# Patient Record
Sex: Male | Born: 1980 | Race: White | Hispanic: No | Marital: Single | Smoking: Former smoker
Health system: Southern US, Community
[De-identification: ages and names within clinical notes are randomized; demographics above are authoritative.]

## PROBLEM LIST (undated history)

## (undated) DIAGNOSIS — F329 Major depressive disorder, single episode, unspecified: Secondary | ICD-10-CM

## (undated) DIAGNOSIS — I10 Essential (primary) hypertension: Secondary | ICD-10-CM

## (undated) DIAGNOSIS — K219 Gastro-esophageal reflux disease without esophagitis: Secondary | ICD-10-CM

## (undated) DIAGNOSIS — N200 Calculus of kidney: Secondary | ICD-10-CM

## (undated) DIAGNOSIS — F32A Depression, unspecified: Secondary | ICD-10-CM

## (undated) DIAGNOSIS — F419 Anxiety disorder, unspecified: Secondary | ICD-10-CM

## (undated) HISTORY — DX: Major depressive disorder, single episode, unspecified: F32.9

## (undated) HISTORY — DX: Depression, unspecified: F32.A

## (undated) HISTORY — DX: Anxiety disorder, unspecified: F41.9

## (undated) HISTORY — DX: Essential (primary) hypertension: I10

## (undated) HISTORY — PX: LITHOTRIPSY: SUR834

## (undated) HISTORY — PX: INNER EAR SURGERY: SHX679

---

## 2014-03-02 ENCOUNTER — Encounter: Payer: Self-pay | Admitting: Family Medicine

## 2014-03-02 DIAGNOSIS — R109 Unspecified abdominal pain: Secondary | ICD-10-CM | POA: Insufficient documentation

## 2014-03-02 DIAGNOSIS — H7310 Chronic myringitis, unspecified ear: Secondary | ICD-10-CM

## 2014-03-02 DIAGNOSIS — H7010 Chronic mastoiditis, unspecified ear: Secondary | ICD-10-CM | POA: Insufficient documentation

## 2014-03-02 DIAGNOSIS — M5416 Radiculopathy, lumbar region: Secondary | ICD-10-CM | POA: Insufficient documentation

## 2014-03-02 DIAGNOSIS — H9190 Unspecified hearing loss, unspecified ear: Secondary | ICD-10-CM | POA: Insufficient documentation

## 2014-03-03 ENCOUNTER — Ambulatory Visit (INDEPENDENT_AMBULATORY_CARE_PROVIDER_SITE_OTHER): Payer: BC Managed Care – PPO | Admitting: Family Medicine

## 2014-03-03 ENCOUNTER — Encounter: Payer: Self-pay | Admitting: Family Medicine

## 2014-03-03 VITALS — BP 135/88 | HR 82 | Ht 69.0 in | Wt 167.0 lb

## 2014-03-03 DIAGNOSIS — F411 Generalized anxiety disorder: Secondary | ICD-10-CM

## 2014-03-03 DIAGNOSIS — R109 Unspecified abdominal pain: Secondary | ICD-10-CM

## 2014-03-03 DIAGNOSIS — H7102 Cholesteatoma of attic, left ear: Secondary | ICD-10-CM

## 2014-03-03 DIAGNOSIS — H7100 Cholesteatoma of attic, unspecified ear: Secondary | ICD-10-CM

## 2014-03-03 DIAGNOSIS — E785 Hyperlipidemia, unspecified: Secondary | ICD-10-CM

## 2014-03-03 MED ORDER — ALPRAZOLAM 0.25 MG PO TABS: 0.2500 mg | ORAL_TABLET | Freq: Every day | ORAL | Status: DC | PRN

## 2014-03-03 NOTE — Progress Notes (Signed)
CC: [name] is a 33 y.o. male is here for Establish Care   Subjective: HPI:  Pleasant 34 year old here to establish care  Patient complains of chronic abdominal pain localized in the epigastric region. He was diagnosed with gastritis on a recent endoscopy. Since starting Protonix he states that symptoms are almost completely gone now. Nothing else seems to make pain better or worse. It is nonradiating.  Patient reports a history of anxiety that has been present for matter of years. He was once on 200 mg of Zoloft however it caused intolerable sexual side effects and a tremor. Since being on 100 mg for the past year he denies any mental disturbance other than anxiety. Symptoms fluctuate on a daily basis, worse when his physical health is in jeopardy. He takes Xanax sparingly less thanof the week for panic attacks. Nothing else seems to make symptoms better or worse. He has been seen a psychiatrist and would like to establish with a psychiatrist within our system.  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 He would like to establish with a clinic closer to [facility].  He's currently using antibiotic ear drops in both ears and denies fevers, chills.  He has chronic drainage from the left ear ever since surgery last month. Hearing loss is present but improving, denies dizziness or ear pain.  Chart review he has a history of hyperlipidemia seen back in 2009 with [facility]. No intervention since this check.  Review of Systems - General ROS: negative for - chills, fever, night sweats, weight gain or weight loss Ophthalmic ROS: negative for - decreased vision Psychological ROS: negative for - anxiety or depression ENT ROS: negative for -  nasal congestion, tinnitus or allergies Hematological and Lymphatic ROS: negative for - bleeding problems, bruising or swollen lymph nodes Breast ROS: negative Respiratory ROS: no cough, shortness of breath,  or wheezing Cardiovascular ROS: no chest pain or dyspnea on exertion Gastrointestinal ROS: no  change in bowel habits, or black or bloody stools Genito-Urinary ROS: negative for - genital discharge, genital ulcers, incontinence or abnormal bleeding from genitals Musculoskeletal ROS: negative for - joint pain or muscle pain Neurological ROS: negative for - headaches or memory loss Dermatological ROS: negative for lumps, mole changes, rash and skin lesion changes  History reviewed. No pertinent past medical history.  Past Surgical History  Procedure Laterality Date  . Inner ear surgery    . Lithotripsy     History reviewed. No pertinent family history.  History   Social History  . Marital Status: Single    Spouse Name: N/A    Number of Children: N/A  . Years of Education: N/A   Occupational History  . Not on file.   Social History Main Topics  . Smoking status: Current Every Day Smoker  . Smokeless tobacco: Not on file  . Alcohol Use: No  . Drug Use: Not on file  . Sexual Activity: No   Other Topics Concern  . Not on file   Social History Narrative  . No narrative on file     Objective: BP 135/88  Pulse 82  Ht 5\' 9"  (1.753 m)  Wt 167 lb (75.751 kg)  BMI 24.65 kg/m2  General: Alert and Oriented, No Acute Distress HEENT: Pupils equal, round, reactive to light. Conjunctivae clear.  External ears unremarkable, canals are clear with a green tympanostomy tube in the right ear drum. Left eardrum has a 1 mm perforation in the  inferior aspect of the membrane without any active drainage..  Middle ear appears open without effusion. Pink inferior turbinates.  Moist mucous membranes, pharynx without inflammation nor lesions.  Neck supple without palpable lymphadenopathy nor abnormal masses. Lungs: Clear to auscultation bilaterally, no wheezing/ronchi/rales.  Comfortable work of breathing. Good air movement. Cardiac: Regular rate and rhythm. Normal S1/S2.  No murmurs, rubs, nor  gallops.   Abdomen: Soft nontender Extremities: No peripheral edema.  Strong peripheral pulses.  Mental Status: No depression, anxiety, nor agitation. Skin: Warm and dry.  Assessment & Plan: Gabriel Shelton was seen today for establish care.  Diagnoses and associated orders for this visit:  Hyperlipidemia - Lipid panel  Cholesteatoma of attic of left ear - Ambulatory referral to ENT  Generalized anxiety disorder - Ambulatory referral to Psychiatry - ALPRAZolam (XANAX) 0.25 MG tablet; Take 1 tablet (0.25 mg total) by mouth daily as needed for anxiety.  Abdominal pain, unspecified site    Hyperlipidemia: Due for repeat lipid panel Cholesteatoma: Based on outside records and believes that this was removed however he would like to followup for long term care with [facility], referral has been placed continue antibiotic ear drops Generalized anxiety disorder: Control chronic condition, Small prescription of Xanax provided until he can establish with with a local psychiatrist, referral has been placed continue Zoloft Abdominal pain: Improving on Protonix continue twice a day dosing.   Return in about 3 months (around 06/03/2014).

## 2014-03-10 ENCOUNTER — Encounter: Payer: Self-pay | Admitting: Family Medicine

## 2014-03-17 ENCOUNTER — Ambulatory Visit (INDEPENDENT_AMBULATORY_CARE_PROVIDER_SITE_OTHER): Payer: BC Managed Care – PPO | Admitting: Psychiatry

## 2014-03-17 ENCOUNTER — Telehealth: Payer: Self-pay | Admitting: Family Medicine

## 2014-03-17 ENCOUNTER — Encounter (HOSPITAL_COMMUNITY): Payer: Self-pay | Admitting: Psychiatry

## 2014-03-17 VITALS — BP 130/80 | HR 70 | Ht 69.0 in | Wt 168.0 lb

## 2014-03-17 DIAGNOSIS — F4010 Social phobia, unspecified: Secondary | ICD-10-CM

## 2014-03-17 DIAGNOSIS — F411 Generalized anxiety disorder: Secondary | ICD-10-CM

## 2014-03-17 DIAGNOSIS — F339 Major depressive disorder, recurrent, unspecified: Secondary | ICD-10-CM

## 2014-03-17 DIAGNOSIS — F329 Major depressive disorder, single episode, unspecified: Secondary | ICD-10-CM

## 2014-03-17 MED ORDER — ESCITALOPRAM OXALATE 10 MG PO TABS: 10.0000 mg | ORAL_TABLET | Freq: Every day | ORAL | Status: DC

## 2014-03-17 MED ORDER — ALPRAZOLAM 0.25 MG PO TABS
0.2500 mg | ORAL_TABLET | Freq: Every day | ORAL | Status: DC | PRN
Start: 2014-03-17 — End: 2014-05-23

## 2014-03-17 NOTE — Telephone Encounter (Signed)
Dr. Ivan AnchorsHommel I called behavioral health regarding an appoint for Gabriel Shelton D.O.B. 11/03/1980   and they stated that they have [name] on the schedule today at 12:30.

## 2014-03-17 NOTE — Progress Notes (Signed)
Patient ID: Gabriel Shelton, male   DOB: 03/28/1981, 33 y.o.   MRN: [id number]  [facility] Initial [facility]   Gabriel Brunswickric Spradley [id number] 32 y.o.  03/17/2014 12:53 PM  Chief Complaint:  Depression and anxiety  History of Present Illness:   Patient Presents for Initial Evaluation with symptoms of episodic depression. He is followed with [facility] Vito Back[facility]hris Atkins [facility]. He wants to change services to [facility].  his been diagnosed with generalized anxiety disorder possible social anxiety disorder. She's been taking Zoloft and recently started on a small dosl of Xanax which he infrequently uses.  Associated symptoms of depression include withdrawn disturbed sleep energy appetite when he is having an episode. At times they're associated with crying spells and hopelessness but no suicidal or homicidal thoughts. Triggers include being alone or difficulty dealing with social norms.  Modifying factors include medications and hobbies.  Hobbies include flying drones. Duration of depression and anxiety since age 33.  Severity of depression 7/10. 10 being not depressed.  Severity of anxiety 5/10. 10 being severely anxious. Next next  Endorses having panic like symptoms including sweating palpitations when he is in an uncomfortable situation or crowd. He carries a small dose of Xanax with him that helps him in case he is having a panic attack. No other specific triggers for panic attacks. Sometimes he has a panic attack out of the blue. He continues to work at [employer] daily full-time his symptoms have not caused him any decrease in functional ability. He feels comfortable with the current medication he was on Zoloft 200 mg but cut it down to 100 mg. He started back on Zoloft 3 months ago because he was not taking medication and he felt more down.  Zoloft does help him but he wants to try something different. I cautioned him that will not continue Xanax for a long time so we  will consider other medication options for anxiety and depression.  There is no associated symptoms of psychosis there is no prior history of mania. Patient also denies use of any illicit drugs or alcohol.     PTSD Symptoms: denies    Past Psychiatric History/Hospitalization(s) denies  Hospitalization for psychiatric illness: No History of Electroconvulsive Shock Therapy: No Prior Suicide Attempts: No  Medical History; Past Medical History  Diagnosis Date  . Anxiety   . Depression     Allergies: Allergies  Allergen Reactions  . Ambien [Zolpidem Tartrate]     hallucinations    Medications: Outpatient Encounter Prescriptions as of 03/17/2014  Medication Sig  . ALPRAZolam (XANAX) 0.25 MG tablet Take 1 tablet (0.25 mg total) by mouth daily as needed for anxiety.  Marland Kitchen. escitalopram (LEXAPRO) 10 MG tablet Take 1 tablet (10 mg total) by mouth daily.  . Pantoprazole Sodium (PROTONIX PO) Take by mouth.  . [DISCONTINUED] ALPRAZolam (XANAX) 0.25 MG tablet Take 1 tablet (0.25 mg total) by mouth daily as needed for anxiety.  . [DISCONTINUED] sertraline (ZOLOFT) 100 MG tablet Take 100 mg by mouth daily.     Substance Abuse History: Denies   Family History; Family History  Problem Relation Age of Onset  . Bipolar disorder Neg Hx   . Depression Neg Hx    denies   Biopsychosocial History:  Grew up with Parents. No trauma or abuse. Finished school and college. Works FT with [employer]. Not married and does not have kids.    Labs:  No results found for this or any previous visit (from the  past 2160 hour(s)).     Musculoskeletal: Strength & Muscle Tone: within normal limits Gait & Station: normal Patient leans: N/A  Mental Status Examination;   Psychiatric Specialty Exam: Physical Exam  Vitals reviewed. Constitutional: He appears well-nourished. No distress.  HENT:  Head: Normocephalic and atraumatic.    Review of Systems  Constitutional: Negative.   HENT:  Negative.   Respiratory: Negative.   Cardiovascular: Negative.   Gastrointestinal: Positive for heartburn. Negative for nausea and vomiting.  Genitourinary: Negative.   Musculoskeletal: Negative.   Neurological: Negative.   Endo/Heme/Allergies: Negative.   Psychiatric/Behavioral: Positive for depression. The patient is nervous/anxious.     Blood pressure 130/80, pulse 70, height 5\' 9"  (1.753 m), weight 168 lb (76.204 kg).Body mass index is 24.8 kg/(m^2).  General Appearance: Casual  Eye Contact::  Good  Speech:  Slow  Volume:  Normal  Mood:  Dysphoric  Affect:  Congruent  Thought Process:  Coherent  Orientation:  Full (Time, Place, and Person)  Thought Content:  Rumination  Suicidal Thoughts:  No  Homicidal Thoughts:  No  Memory:  Immediate;   Fair Recent;   Fair  Judgement:  Fair  Insight:  Fair  Psychomotor Activity:  Normal  Concentration:  Fair  Recall:  Fair  Akathisia:  Negative  Handed:  Right  AIMS (if indicated):     Assets:  Communication Skills Desire for Improvement Financial Resources/Insurance Housing Leisure Time Physical Health  Sleep:        Assessment: Axis I: Maj. depressive disorder unspecified. Generalized anxiety disorder. Rule out social anxiety disorder  Axis II: Deferred  Axis III:  Past Medical History  Diagnosis Date  . Anxiety   . Depression     Axis IV: Difficult to adjust in social situations.   Treatment Plan and Summary: Discontinue Zoloft because he wants to try something different he has been on this medication a long time. We'll start him on Lexapro discussed the various side effects including nausea and headache possibility in the morning. Continue small dose of Xanax as of now. Discontinue Xanax in the next one to 2 months recommend individual therapy for social anxiety and depression no clear indicators trigger for depression as of now. Pertinent Labs and Relevant Prior Notes reviewed. Medication Side effects, benefits  and risks reviewed/discussed with Patient. Time given for patient to respond and asks questions regarding the Diagnosis and Medications. Safety concerns and to report to ER if suicidal or call 911. Relevant Medications refilled or called in to pharmacy. Discussed weight maintenance and Sleep Hygiene. Follow up with Primary care provider in regards to Medical conditions. Recommend compliance with medications and follow up office appointments. Discussed to avail opportunity to consider or/and continue Individual therapy with Counselor. Greater than 50% of time was spend in counseling and coordination of care with the patient.  Schedule for Follow up visit in 4 weeks or call in earlier as necessary.   Thresa RossAKHTAR, NADEEM, MD 03/17/2014

## 2014-03-30 ENCOUNTER — Ambulatory Visit (INDEPENDENT_AMBULATORY_CARE_PROVIDER_SITE_OTHER): Payer: BC Managed Care – PPO | Admitting: Professional Counselor

## 2014-03-30 DIAGNOSIS — F339 Major depressive disorder, recurrent, unspecified: Secondary | ICD-10-CM

## 2014-03-30 DIAGNOSIS — F411 Generalized anxiety disorder: Secondary | ICD-10-CM

## 2014-03-31 ENCOUNTER — Encounter (HOSPITAL_COMMUNITY): Payer: Self-pay | Admitting: Professional Counselor
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
## 2014-04-01 ENCOUNTER — Ambulatory Visit (HOSPITAL_COMMUNITY): Payer: BC Managed Care – PPO | Admitting: Professional Counselor

## 2014-04-13 ENCOUNTER — Emergency Department (INDEPENDENT_AMBULATORY_CARE_PROVIDER_SITE_OTHER)
Admission: EM | Admit: 2014-04-13 | Discharge: 2014-04-13 | Disposition: A | Discharge status: Home / Self Care | Payer: BC Managed Care – PPO | Source: Home / Self Care | Attending: Family Medicine | Admitting: Family Medicine

## 2014-04-13 ENCOUNTER — Emergency Department (INDEPENDENT_AMBULATORY_CARE_PROVIDER_SITE_OTHER): Payer: BC Managed Care – PPO

## 2014-04-13 ENCOUNTER — Encounter: Payer: Self-pay | Admitting: Emergency Medicine

## 2014-04-13 DIAGNOSIS — M461 Sacroiliitis, not elsewhere classified: Secondary | ICD-10-CM

## 2014-04-13 DIAGNOSIS — M5489 Other dorsalgia: Secondary | ICD-10-CM

## 2014-04-13 DIAGNOSIS — M549 Dorsalgia, unspecified: Secondary | ICD-10-CM

## 2014-04-13 DIAGNOSIS — K219 Gastro-esophageal reflux disease without esophagitis: Secondary | ICD-10-CM | POA: Insufficient documentation

## 2014-04-13 HISTORY — DX: Gastro-esophageal reflux disease without esophagitis: K21.9

## 2014-04-13 MED ORDER — MELOXICAM 15 MG PO TABS: 15.0000 mg | ORAL_TABLET | Freq: Every day | ORAL | Status: DC

## 2014-04-13 NOTE — Discharge Instructions (Signed)
Apply heating pad once or twice daily.  Begin stretching exercises.

## 2014-04-13 NOTE — ED Notes (Signed)
Gabriel Shelton complains of low back, right side back pain for 3 weeks. He rates the pain as a 6/10 when walking. He did fall about 3 weeks ago while moving furniture. [facility] was [facility] and treated with prednisone, tramadol and a muscle relaxer.

## 2014-04-13 NOTE — ED Provider Notes (Signed)
CSN: [id number]     Arrival date & time 04/13/14  1645 History   First MD Initiated Contact with Patient 04/13/14 1753     Chief Complaint  Patient presents with  . Back Pain      HPI Comments: Patient states that he fell while moving furniture three weeks ago.  He complains of persistent pain in his right lower back and side.  The pain is worse when walking, lifting, and bending.  He was [facility] by [facility] and treated with prednisone, tramadol and a muscle relaxer but his symptoms persist.   Patient is a 33 y.o. male presenting with back pain. The history is provided by the patient.  Back Pain Location:  Sacro-iliac joint Quality:  Aching Radiates to:  Does not radiate Pain severity:  Moderate Pain is:  Worse during the day Onset quality:  Sudden Duration:  3 weeks Timing:  Constant Progression:  Unchanged Chronicity:  New Context: falling and lifting heavy objects   Relieved by:  Nothing Worsened by:  Bending (walking) Ineffective treatments:  Muscle relaxants (prednisone) Associated symptoms: no abdominal pain, no bladder incontinence, no bowel incontinence, no dysuria, no fever, no leg pain, no numbness, no paresthesias, no perianal numbness, no tingling, no weakness and no weight loss     Past Medical History  Diagnosis Date  . Anxiety   . Depression   . GERD (gastroesophageal reflux disease)    Past Surgical History  Procedure Laterality Date  . Inner ear surgery    . Lithotripsy     Family History  Problem Relation Age of Onset  . Bipolar disorder Neg Hx   . Depression Neg Hx    History  Substance Use Topics  . Smoking status: Current Every Day Smoker -- 1.00 packs/day for 12 years  . Smokeless tobacco: Never Used  . Alcohol Use: No    Review of Systems  Constitutional: Negative for fever and weight loss.  Gastrointestinal: Negative for abdominal pain and bowel incontinence.  Genitourinary: Negative for bladder incontinence and dysuria.   Musculoskeletal: Positive for back pain.  Neurological: Negative for tingling, weakness, numbness and paresthesias.  All other systems reviewed and are negative.   Allergies  Ambien  Home Medications   Prior to Admission medications   Medication Sig Start Date End Date Taking? Authorizing Provider  ALPRAZolam (XANAX) 0.25 MG tablet Take 1 tablet (0.25 mg total) by mouth daily as needed for anxiety. 03/17/14  Yes Thresa Ross, MD  escitalopram (LEXAPRO) 10 MG tablet Take 1 tablet (10 mg total) by mouth daily. 03/17/14 03/17/15 Yes Thresa Ross, MD  Pantoprazole Sodium (PROTONIX PO) Take by mouth.   Yes Historical Provider, MD  meloxicam (MOBIC) 15 MG tablet Take 1 tablet (15 mg total) by mouth daily. Take with food each morning 04/13/14   Lattie Haw, MD   BP 125/85  Pulse 84  Temp(Src) 98.4 F (36.9 C) (Oral)  Ht 5\' 9"  (1.753 m)  Wt 165 lb (74.844 kg)  BMI 24.36 kg/m2  SpO2 99% Physical Exam Nursing notes and Vital Signs reviewed. Appearance:  Patient appears healthy, stated age, and in no acute distress Eyes:  Pupils are equal, round, and reactive to light and accomodation.  Extraocular movement is intact.  Conjunctivae are not inflamed  Pharynx:  Normal Neck:  Supple.  No adenopathy Lungs:  Clear to auscultation.  Breath sounds are equal.  Heart:  Regular rate and rhythm without murmurs, rubs, or gallops.  Abdomen:  Nontender without masses or  hepatosplenomegaly.  Bowel sounds are present.  No CVA or flank tenderness Back:  Good range of motion.  Can heel/toe walk and squat without difficulty.  There is tenderness over the right SI joint  Straight leg raising test is negative.  Sitting knee extension test is negative.  Strength and sensation in the lower extremities is normal.  Patellar and achilles reflexes are normal.  FABER test localizes to the right SI joint   Extremities:  No edema.  No calf tenderness Skin:  No rash present.   ED Course  Procedures  none      Imaging Review Dg Lumbar Spine Complete  04/13/2014   CLINICAL DATA:  Right-sided back pain after a recent fall.  EXAM: LUMBAR SPINE - COMPLETE 4+ VIEW  COMPARISON:  None.  FINDINGS: Alignment is anatomic. Vertebral body and disc space height are maintained. No significant degenerative changes. No pars defects.  IMPRESSION: No findings to explain the patient's pain.   Electronically Signed   By: Leanna BattlesMelinda  Blietz M.D.   On: 04/13/2014 17:49     MDM   1. Right-sided back pain, unspecified location   2. SI (sacroiliac) joint inflammation      Begin Mobic Apply heating pad once or twice daily.  Begin stretching exercises. Followup with Dr. Rodney Langtonhomas Thekkekand[facility] ([facility] if not improving about two weeks.    Lattie HawStephen A Beese, MD 04/19/14 [phone]

## 2014-04-19 ENCOUNTER — Encounter (HOSPITAL_COMMUNITY): Payer: Self-pay | Admitting: Psychiatry

## 2014-04-19 ENCOUNTER — Ambulatory Visit (INDEPENDENT_AMBULATORY_CARE_PROVIDER_SITE_OTHER): Payer: BC Managed Care – PPO | Admitting: Psychiatry

## 2014-04-19 VITALS — BP 108/76 | HR 95 | Ht 69.0 in | Wt 167.0 lb

## 2014-04-19 DIAGNOSIS — F339 Major depressive disorder, recurrent, unspecified: Secondary | ICD-10-CM

## 2014-04-19 DIAGNOSIS — F411 Generalized anxiety disorder: Secondary | ICD-10-CM

## 2014-04-19 DIAGNOSIS — F329 Major depressive disorder, single episode, unspecified: Secondary | ICD-10-CM

## 2014-04-19 MED ORDER — ESCITALOPRAM OXALATE 10 MG PO TABS: 10.0000 mg | ORAL_TABLET | Freq: Every day | ORAL | Status: DC

## 2014-04-19 NOTE — Progress Notes (Signed)
Patient ID: Gabriel Shelton, male   DOB: 20-Feb-1981, 33 y.o.   MRN: [id number] [facility] Follow-up Outpatient Visit  Gabriel Shelton [id number] 33 y.o.  04/19/2014  Chief Complaint: depression and anxiety.     History of Present Illness:   Patient returns for Medication Follow up and is diagnosed with Major depressive disorder,  social anxiety disorder and generalized anxiety disorder.   He has  followed with Dr. Vito Backers in [facility] [place] and had wanted to change service to this clinic. Has been on Zoloft which did not help. Last visit we changed to lexapro and is responding better. Associated symptoms of depression include withdrawn disturbed sleep energy appetite when he is having an episode. At times they're associated with crying spells and hopelessness but no suicidal or homicidal thoughts. Triggers include being alone or difficulty dealing with social norms. He is showing improvement in his symptoms with Lexapro with no reported side effects. He does get anxious when he is by himself but at work he likes it and he keep himself busy.  Modifying factors include medications and hobbies. Hobbies include flying drones.  Duration of depression and anxiety since age 33.  Severity of depression 7/10. 10 being not depressed.  Severity of anxiety 5/10. 10 being severely anxious. Next next  Endorses having panic like symptoms including sweating palpitations when he is in an uncomfortable situation or crowd. He carries a small dose of Xanax with him that helps him in case he is having a panic attack. Has not used xanax in last week.  No other specific triggers for panic attacks. Sometimes he has a panic attack out of the blue. He continues to work at [employer] daily full-time his symptoms have not caused him any decrease in functional ability.   There is no associated symptoms of psychosis there is no prior history of mania. Patient also denies use of any illicit drugs or alcohol.        Past Medical History  Diagnosis Date  . Anxiety   . Depression   . GERD (gastroesophageal reflux disease)    Family History  Problem Relation Age of Onset  . Bipolar disorder Neg Hx   . Depression Neg Hx     Outpatient Encounter Prescriptions as of 04/19/2014  Medication Sig  . ALPRAZolam (XANAX) 0.25 MG tablet Take 1 tablet (0.25 mg total) by mouth daily as needed for anxiety.  Marland Kitchen escitalopram (LEXAPRO) 10 MG tablet Take 1 tablet (10 mg total) by mouth daily.  . meloxicam (MOBIC) 15 MG tablet Take 1 tablet (15 mg total) by mouth daily. Take with food each morning  . Pantoprazole Sodium (PROTONIX PO) Take by mouth.  . [DISCONTINUED] escitalopram (LEXAPRO) 10 MG tablet Take 1 tablet (10 mg total) by mouth daily.    No results found for this or any previous visit (from the past 2160 hour(s)).  BP 108/76  Pulse 95  Ht  (1.753 m)  Wt 167 lb (75.751 kg)  BMI 24.65 kg/m2   Review of Systems  Constitutional: Negative.   Psychiatric/Behavioral: Negative for depression and suicidal ideas. The patient is nervous/anxious.     Mental Status Examination  Appearance: casual Alert: Yes Attention: fair  Cooperative: Yes Eye Contact: Good Speech: coherent Psychomotor Activity: Decreased Memory/Concentration: adequate Oriented: person, place and time/date Mood: Euthymic Affect: Constricted Thought Processes and Associations: Coherent Fund of Knowledge: Fair Thought Content: Suicidal ideation and Homicidal ideation were denied. Insight: Fair Judgement: Fair  Diagnosis: Maj. depressive disorder.  Generalized anxiety disorder and social anxiety disorder  Treatment Plan:   Continue Lexapro at a dose of 10 mg he is responding better. He has said that he wanted these for followup tomorrow and discuss his anxiety symptoms and social anxiety. Sleep has improved some  Pertinent Labs and Relevant Prior Notes reviewed. Medication Side effects, benefits and risks  reviewed/discussed with Patient. Time given for patient to respond and asks questions regarding the Diagnosis and Medications. Safety concerns and to report to ER if suicidal or call 911. Relevant Medications refilled or called in to pharmacy. Discussed weight maintenance and Sleep Hygiene. Follow up with Primary care provider in regards to Medical conditions. Recommend compliance with medications and follow up office appointments. Discussed to avail opportunity to consider or/and continue Individual therapy with Counselor. Greater than 50% of time was spend in counseling and coordination of care with the patient.  Schedule for Follow up visit in 2 months or call in earlier as necessary.  Thresa Ross, MD 04/19/2014

## 2014-04-20 ENCOUNTER — Ambulatory Visit (INDEPENDENT_AMBULATORY_CARE_PROVIDER_SITE_OTHER): Payer: BC Managed Care – PPO | Admitting: Professional Counselor

## 2014-04-20 ENCOUNTER — Encounter (HOSPITAL_COMMUNITY): Payer: Self-pay | Admitting: Professional Counselor

## 2014-04-20 DIAGNOSIS — F339 Major depressive disorder, recurrent, unspecified: Secondary | ICD-10-CM

## 2014-04-20 NOTE — Progress Notes (Signed)
   THERAPIST PROGRESS NOTE  Session Time: 9:00-9:45am  Participation Level: Active  Behavioral Response: GuardedAlertblunted  Type of Therapy: Individual Therapy  Treatment Goals addressed: Diagnosis: Depression  Interventions: Motivational Interviewing and Social Skills Training  Summary: Gabriel Shelton is a 33 y.o. male who presents with flat, guarded mood.  Patient does not seem depressed or anxious by observation and report and is closed in explaining mood, events in life, and issues.  Most of his answers are one worded and it is hard to understand his true feelings as he is so guarded.  LCSW continues to work to build rapport with patient by discussing common interests, hobbies of patient and goals, but patient does not give a lot of information.  He is able to complete his treatment plan, reports he is compliant with medication and feeling better on his medication.  He wants to focus on his depression and self esteem issues with a goal for treatment being "to feel better about my life and develop meaning and purpose."  LCSW attempted to use CBT to understand thoughts of self, irrational thoughts, beliefs and values, however patient was rigid and unable to process ideals reporting: "I don't know" and laughs due to being uncomfortable. Patient does report he has been having pain in back and sleep problems. LCSW reviewed sleep hygenic and different stretching options using yoga and mediation that patient was given as homework to try. Patient seem interested and invested to document sleep issues and try interventions.   Suicidal/Homicidal: Nowithout intent/plan  Therapist Response: LCSW assess overall function level of patient with anxiety, depression, and self esteem. Patient reports he does not feel anxious at this time, does not want this to be part of treatment and feels medication has helped stabilize his anxiety. He wants to focus on the apathy in his life and depression. He was open to  discussing changes to make in life with social situation, living situation, and self worth.  He is limited in processing his feelings and emotions due to be guarded and shows evidence of poor trust in therapist, but slowly building rapport.  His focus will be on his purpose and understanding his depression while in therapy.  Plan: Return again in 2 weeks.  Diagnosis: Axis I: Major Depression, single episode, unspecified.     Axis II: Deferred    Cordella RegisterCoble, Baneza Bartoszek N, LCSW 04/20/2014

## 2014-05-05 ENCOUNTER — Ambulatory Visit (HOSPITAL_COMMUNITY): Payer: Self-pay | Admitting: Professional Counselor

## 2014-05-09 ENCOUNTER — Emergency Department (INDEPENDENT_AMBULATORY_CARE_PROVIDER_SITE_OTHER): Payer: BC Managed Care – PPO

## 2014-05-09 ENCOUNTER — Telehealth: Payer: Self-pay | Admitting: *Deleted

## 2014-05-09 ENCOUNTER — Encounter: Payer: Self-pay | Admitting: Emergency Medicine

## 2014-05-09 ENCOUNTER — Emergency Department (INDEPENDENT_AMBULATORY_CARE_PROVIDER_SITE_OTHER)
Admission: EM | Admit: 2014-05-09 | Discharge: 2014-05-09 | Disposition: A | Discharge status: Home / Self Care | Payer: BC Managed Care – PPO | Source: Home / Self Care | Attending: Family Medicine | Admitting: Family Medicine

## 2014-05-09 DIAGNOSIS — M542 Cervicalgia: Secondary | ICD-10-CM

## 2014-05-09 DIAGNOSIS — M5412 Radiculopathy, cervical region: Secondary | ICD-10-CM

## 2014-05-09 DIAGNOSIS — M25519 Pain in unspecified shoulder: Secondary | ICD-10-CM

## 2014-05-09 DIAGNOSIS — F411 Generalized anxiety disorder: Secondary | ICD-10-CM

## 2014-05-09 MED ORDER — HYDROCODONE-ACETAMINOPHEN 5-325 MG PO TABS: ORAL_TABLET | ORAL | Status: DC

## 2014-05-09 MED ORDER — TRAMADOL HCL 50 MG PO TABS: 50.0000 mg | ORAL_TABLET | Freq: Every evening | ORAL | Status: DC | PRN

## 2014-05-09 MED ORDER — PREDNISONE 20 MG PO TABS: 20.0000 mg | ORAL_TABLET | Freq: Two times a day (BID) | ORAL | Status: DC

## 2014-05-09 NOTE — Discharge Instructions (Signed)
Apply ice pack to right neck for 30 minutes, 2 or 3 times daily.  Avoid heavy lifting.   Cervical Radiculopathy Cervical radiculopathy happens when a nerve in the neck is pinched or bruised by a slipped (herniated) disk or by arthritic changes in the bones of the cervical spine. This can occur due to an injury or as part of the normal aging process. Pressure on the cervical nerves can cause pain or numbness that runs from your neck all the way down into your arm and fingers. CAUSES  There are many possible causes, including:  Injury.  Muscle tightness in the neck from overuse.  Swollen, painful joints (arthritis).  Breakdown or degeneration in the bones and joints of the spine (spondylosis) due to aging.  Bone spurs that may develop near the cervical nerves. SYMPTOMS  Symptoms include pain, weakness, or numbness in the affected arm and hand. Pain can be severe or irritating. Symptoms may be worse when extending or turning the neck. DIAGNOSIS  Your caregiver will ask about your symptoms and do a physical exam. He or she may test your strength and reflexes. X-rays, CT scans, and MRI scans may be needed in cases of injury or if the symptoms do not go away after a period of time. Electromyography (EMG) or nerve conduction testing may be done to study how your nerves and muscles are working. TREATMENT  Your caregiver may recommend certain exercises to help relieve your symptoms. Cervical radiculopathy can, and often does, get better with time and treatment. If your problems continue, treatment options may include:  Wearing a soft collar for short periods of time.  Physical therapy to strengthen the neck muscles.  Medicines, such as nonsteroidal anti-inflammatory drugs (NSAIDs), oral corticosteroids, or spinal injections.  Surgery. Different types of surgery may be done depending on the cause of your problems. HOME CARE INSTRUCTIONS   Put ice on the affected area.  Put ice in a plastic  bag.  Place a towel between your skin and the bag.  Leave the ice on for 15-20 minutes, 03-04 times a day or as directed by your caregiver.  If ice does not help, you can try using heat. Take a warm shower or bath, or use a hot water bottle as directed by your caregiver.  You may try a gentle neck and shoulder massage.  Use a flat pillow when you sleep.  Only take over-the-counter or prescription medicines for pain, discomfort, or fever as directed by your caregiver.  If physical therapy was prescribed, follow your caregiver's directions.  If a soft collar was prescribed, use it as directed. SEEK IMMEDIATE MEDICAL CARE IF:   Your pain gets much worse and cannot be controlled with medicines.  You have weakness or numbness in your hand, arm, face, or leg.  You have a high fever or a stiff, rigid neck.  You lose bowel or bladder control (incontinence).  You have trouble with walking, balance, or speaking. MAKE SURE YOU:   Understand these instructions.  Will watch your condition.  Will get help right away if you are not doing well or get worse. Document Released: 06/04/2001 Document Revised: 12/02/2011 Document Reviewed: 04/23/2011 [facility] Patient Information 2015 [place], [place]. This information is not intended to replace advice given to you by your health care provider. Make sure you discuss any questions you have with your health care provider.

## 2014-05-09 NOTE — ED Notes (Signed)
Patient reports waking up Sunday morning with sharp right proximal posterior shoulder pain radiating to right side of neck and to right elbow.  Patient denies any injury. Patient reports medicating with Tylenol yesterday and this morning without relief of pain.  Patient also reports placing an ice pack to the area of pain.

## 2014-05-09 NOTE — ED Provider Notes (Signed)
CSN: [id number]     Arrival date & time 05/09/14  0803 History   First MD Initiated Contact with Patient 05/09/14 [phone]     Chief Complaint  Patient presents with  . Shoulder Pain      HPI Comments: Patient reports waking up yesterday morning with sharp right proximal posterior shoulder pain radiating to right side of neck and to right elbow.  Patient denies any injury. Patient reports medicating with Tylenol yesterday and this morning without relief of pain.  Patient also reports placing an ice pack to the area of pain.  He describes the discomfort in his right arm as a tingling sensation  Patient is a 33 y.o. male presenting with shoulder pain. The history is provided by the patient.  Shoulder Pain This is a new problem. The current episode started yesterday. The problem occurs constantly. The problem has not changed since onset.Pertinent negatives include no chest pain. Exacerbated by: movement of neck and shoulder. Nothing relieves the symptoms. Treatments tried: Ibuprofen. The treatment provided no relief.    Past Medical History  Diagnosis Date  . Anxiety   . Depression   . GERD (gastroesophageal reflux disease)    Past Surgical History  Procedure Laterality Date  . Inner ear surgery    . Lithotripsy     Family History  Problem Relation Age of Onset  . Bipolar disorder Neg Hx   . Depression Neg Hx    History  Substance Use Topics  . Smoking status: Current Every Day Smoker -- 1.00 packs/day for 12 years  . Smokeless tobacco: Never Used  . Alcohol Use: No    Review of Systems  Cardiovascular: Negative for chest pain.  All other systems reviewed and are negative.   Allergies  Ambien  Home Medications   Prior to Admission medications   Medication Sig Start Date End Date Taking? Authorizing Provider  escitalopram (LEXAPRO) 10 MG tablet Take 1 tablet (10 mg total) by mouth daily. 04/19/14 04/19/15 Yes Thresa Ross, MD  Pantoprazole Sodium (PROTONIX PO) Take by mouth.    Yes Historical Provider, MD  ALPRAZolam (XANAX) 0.25 MG tablet Take 1 tablet (0.25 mg total) by mouth daily as needed for anxiety. 03/17/14   Thresa Ross, MD  HYDROcodone-acetaminophen (NORCO/VICODIN) 5-325 MG per tablet Take one by mouth at bedtime as needed for pain 05/09/14   Lattie Haw, MD  predniSONE (DELTASONE) 20 MG tablet Take 1 tablet (20 mg total) by mouth 2 (two) times daily. Take with food. 05/09/14   Lattie Haw, MD   BP 126/79  Pulse 78  Temp(Src) 98.3 F (36.8 C) (Oral)  Resp 16  Ht 5\' 9"  (1.753 m)  Wt 165 lb (74.844 kg)  BMI 24.36 kg/m2  SpO2 99% Physical Exam  Nursing note and vitals reviewed. Constitutional: He is oriented to person, place, and time. He appears well-developed and well-nourished. No distress.  HENT:  Head: Normocephalic.  Mouth/Throat: Oropharynx is clear and moist.  Eyes: Conjunctivae are normal. Pupils are equal, round, and reactive to light.  Neck:    Cardiovascular: Normal heart sounds.   Pulmonary/Chest: Breath sounds normal.  Musculoskeletal:       Right shoulder: He exhibits decreased range of motion, tenderness and decreased strength. He exhibits no bony tenderness, no swelling, no effusion, no crepitus and no deformity.       Arms: Right shoulder has mildly decreased range of motion to full active abduction.  Apley's test positive for decreased adduction and internal rotation.  Slight decreased external rotation.  Mild tenderness over insertion of long head of biceps.  Empty can negative.  Hawkins test negative. There is distinct tenderness over right trapezius muscle, extending to right sternocleidomastoid muscle.  Patient has pain in right trapezius, radiating to right arm with left lateral flexion of neck.  Neurological: He is alert and oriented to person, place, and time.  Skin: Skin is warm and dry. No rash noted.    ED Course  Procedures  none     Imaging Review Dg Cervical Spine Complete  05/09/2014   CLINICAL DATA:   Neck pain  EXAM: CERVICAL SPINE  4+ VIEWS  COMPARISON:  None.  FINDINGS: Frontal, lateral, open-mouth odontoid, and bilateral oblique views were obtained. There is no fracture or spondylolisthesis. Prevertebral soft tissues and predental space regions are normal. There is mild disc space narrowing at C4-5, C5-6, and C6-7. There is exit foraminal narrowing at C3-4 and C6-7 bilaterally. There is also slight exit foraminal narrowing on the left C4-5 and C5-6 due to bony hypertrophy.  IMPRESSION: Osteoarthritic changes several levels. No fracture or spondylolisthesis.   Electronically Signed   By: Bretta BangWilliam  Woodruff M.D.   On: 05/09/2014 09:11   Dg Shoulder Right  05/09/2014   CLINICAL DATA:  Right shoulder pain.  EXAM: RIGHT SHOULDER - 2+ VIEW  COMPARISON:  None.  FINDINGS: There is no evidence of fracture or dislocation. There is no evidence of arthropathy or other focal bone abnormality. Soft tissues are unremarkable.  IMPRESSION: Normal exam.   Electronically Signed   By: Geanie CooleyJim  Maxwell M.D.   On: 05/09/2014 09:10     MDM   1. Cervicalgia   2. Cervical radiculopathy    Begin prednisone burst.  Lortab at bedtime prn. Apply ice pack to right neck for 30 minutes, 2 or 3 times daily.  Avoid heavy lifting. Followup with Dr. Rodney Langtonhomas Thekkekandam ([facility] in one week.    Lattie HawStephen A Beese, MD 05/14/14 1114

## 2014-05-16 ENCOUNTER — Ambulatory Visit (INDEPENDENT_AMBULATORY_CARE_PROVIDER_SITE_OTHER): Payer: BC Managed Care – PPO | Admitting: Sports Medicine

## 2014-05-16 ENCOUNTER — Ambulatory Visit (INDEPENDENT_AMBULATORY_CARE_PROVIDER_SITE_OTHER): Payer: BC Managed Care – PPO | Admitting: Professional Counselor

## 2014-05-16 ENCOUNTER — Encounter: Payer: Self-pay | Admitting: Sports Medicine

## 2014-05-16 VITALS — BP 136/85 | HR 96 | Ht 69.0 in | Wt 164.0 lb

## 2014-05-16 DIAGNOSIS — M5412 Radiculopathy, cervical region: Secondary | ICD-10-CM

## 2014-05-16 DIAGNOSIS — F339 Major depressive disorder, recurrent, unspecified: Secondary | ICD-10-CM

## 2014-05-16 MED ORDER — CYCLOBENZAPRINE HCL 10 MG PO TABS: ORAL_TABLET | ORAL | Status: DC

## 2014-05-16 MED ORDER — METHYLPREDNISOLONE SODIUM SUCC 125 MG IJ SOLR: 250.0000 mg | Freq: Once | INTRAMUSCULAR | Status: DC

## 2014-05-16 MED ORDER — MELOXICAM 15 MG PO TABS: ORAL_TABLET | ORAL | Status: DC

## 2014-05-16 MED ORDER — KETOROLAC TROMETHAMINE 30 MG/ML IJ SOLN
30.0000 mg | Freq: Once | INTRAMUSCULAR | Status: AC
Start: 2014-05-16 — End: 2014-05-16
  Administered 2014-05-16: 30 mg via INTRAMUSCULAR

## 2014-05-16 MED ORDER — PREDNISONE (PAK) 10 MG PO TABS: ORAL_TABLET | ORAL | Status: DC

## 2014-05-16 MED ORDER — METHYLPREDNISOLONE SODIUM SUCC 125 MG IJ SOLR
125.0000 mg | Freq: Once | INTRAMUSCULAR | Status: AC
  Administered 2014-05-16: 125 mg via INTRAMUSCULAR

## 2014-05-16 NOTE — Assessment & Plan Note (Signed)
Right C7, toradol/solumedrol. Prednisone taper, flexeril, meloxicam, PT. Return in 4 weeks, MRI if no better. Light duty at work.

## 2014-05-16 NOTE — Progress Notes (Signed)
Patient ID: Gabriel Shelton, male   DOB: 08/11/1981, 33 y.o.   MRN: [id number]    Subjective:    I'm seeing this patient as a consultation for: Gabriel Christen, MD  CC: Right shoulder pain  HPI: Gabriel Shelton is a 34 year old male who presents with 8 days of worsening right shoulder pain. Initially presented to our Urgent Care on 8/17, where shoulder x-ray was normal and cervical spine x-ray revealed osteoarthritic changes at several levels without fracture or spondylolisthesis. Pain is most severe in the right shoulder, but is also present in the right side of the neck. Also reports tingling and changes in sensation over the medial arm and into the hand on the right side. Initially had right-sided facial numbness, but this has since resolved. Has taken Norco/Vicodin prescribed in our Urgent Care, which did help to relieve the pain.  Past medical history, Surgical history, Family history not pertinant except as noted below, Social history, Allergies, and medications have been entered into the medical record, reviewed, and no changes needed.   Review of Systems: No headache, visual changes, nausea, vomiting, diarrhea, constipation, dizziness, abdominal pain, skin rash, fevers, chills, night sweats, weight loss, swollen lymph nodes, body aches, joint swelling, muscle aches, chest pain, shortness of breath, mood changes, visual or auditory hallucinations.   Objective:   General: Well Developed, well nourished, and in no acute distress.  Neuro/Psych: Alert and oriented x3, extra-ocular muscles intact, able to move all 4 extremities, sensation grossly intact. Skin: Warm and dry, no rashes noted.  Respiratory: Not using accessory muscles, speaking in full sentences, trachea midline.  Cardiovascular: Pulses palpable, no extremity edema. Abdomen: Does not appear distended.  Right Shoulder: Inspection reveals no abnormalities, atrophy or asymmetry. Palpation is normal with no tenderness over AC joint or  bicipital groove. ROM is full in all planes. Rotator cuff strength normal throughout. Positive Hawkin's and Neer's test. Speeds and Yergason's tests normal. No labral pathology noted with negative Obrien's, negative clunk and good stability. Normal scapular function observed. No painful arc and no drop arm sign. No apprehension sign Pain improved with abduction. Altered sensation along the medial aspect of the arm.  Impression and Recommendations:   This case required medical decision making of moderate complexity.  Right Shoulder Pain: Pain of the shoulder that radiates from the neck into the fingers paired with cervical spine imaging that suggests osteoarthritic changes is indicative of cervical radiculopathy. Changes in sensation along the medial aspect of the arm are also suggestive. - Toradol 30 mg/mL, 1 mL injection - Solumedrol 125 mg/2 mL, 2 mL injection - Mobic once daily for two weeks, then PRN for pain - Prednisone 12 day taper pack - MRI of cervical spine if no improvement in one month

## 2014-05-17 ENCOUNTER — Encounter (HOSPITAL_COMMUNITY): Payer: Self-pay | Admitting: Professional Counselor

## 2014-05-17 NOTE — Progress Notes (Signed)
   THERAPIST PROGRESS NOTE  Session Time: 3:00pm-3:50pm  Participation Level: Minimal  Behavioral Response: CasualAlertDysphoric  Type of Therapy: Individual Therapy  Treatment Goals addressed: Coping and Depression  Interventions: CBT and Motivational Interviewing  Summary: Gabriel Shelton is a 33 y.o. male who presents with flat affect and depressed mood.  He continues to discuss how he wants to make changes, but accountability is low and patient has not attempted any of the interventions, homework assignments related to increasing social life and living more independently. He does report he injured his shoulder last week with unknown cause which has regressed him in being more sociable.  He reports he looked over some of the web sites and referrals LCSW gave him, but nothing seemed to interest him, thus he did not follow up. LCSW discussed evidence of resistance and  Stages of change along with fear of failure and acceptance of others.  He reports he is aware and will try some of the things again this week and re-look at social outings.    Suicidal/Homicidal: Nowithout intent/plan  Therapist Response: Sixto overall is functioning with stability with no known causes for increased depression of anxiety. LCSW attempted to use motivational interviewing and CBT as tools to engage patient and define his willingness to change. Patient shows minimal progression, but reviewed different social skills and ways of making friends incorporating coping strategies, communication techniques, and disputing irrational thoughts.   Plan: This was patient's last appointment as therapist will be on maternity leave. Patient was referred to [facility] office as well as follow up with [facility] office once therapist is replaced.   Diagnosis: Axis I: Depressive Disorder NOS    Axis II: Deferred    Raye Sorrow, LCSW 05/17/2014

## 2014-05-23 ENCOUNTER — Telehealth (HOSPITAL_COMMUNITY): Payer: Self-pay | Admitting: *Deleted

## 2014-05-23 ENCOUNTER — Encounter: Payer: Self-pay | Admitting: Sports Medicine

## 2014-05-23 MED ORDER — ALPRAZOLAM 0.25 MG PO TABS: 0.2500 mg | ORAL_TABLET | Freq: Every day | ORAL | Status: DC | PRN

## 2014-05-23 NOTE — Telephone Encounter (Signed)
Notify pt prescription is ready for pick-up.

## 2014-05-23 NOTE — Telephone Encounter (Signed)
Xanax .  prescription printed for pick up.

## 2014-05-24 ENCOUNTER — Other Ambulatory Visit: Payer: Self-pay | Admitting: Sports Medicine

## 2014-05-24 ENCOUNTER — Telehealth: Payer: Self-pay | Admitting: Sports Medicine

## 2014-05-24 MED ORDER — GABAPENTIN 300 MG PO CAPS: ORAL_CAPSULE | ORAL | Status: DC

## 2014-05-25 ENCOUNTER — Telehealth: Payer: Self-pay

## 2014-05-25 ENCOUNTER — Ambulatory Visit (INDEPENDENT_AMBULATORY_CARE_PROVIDER_SITE_OTHER): Payer: BC Managed Care – PPO | Admitting: Physical Therapy

## 2014-05-25 DIAGNOSIS — M5412 Radiculopathy, cervical region: Secondary | ICD-10-CM

## 2014-05-25 DIAGNOSIS — M2550 Pain in unspecified joint: Secondary | ICD-10-CM | POA: Diagnosis not present

## 2014-05-25 DIAGNOSIS — R5381 Other malaise: Secondary | ICD-10-CM

## 2014-05-25 DIAGNOSIS — M2560 Stiffness of unspecified joint, not elsewhere classified: Secondary | ICD-10-CM | POA: Diagnosis not present

## 2014-05-25 DIAGNOSIS — M542 Cervicalgia: Secondary | ICD-10-CM

## 2014-05-25 NOTE — Telephone Encounter (Signed)
Keshav complains he is still having pain in neck down arms and wrist. He states he would like a refill on Flexeril and prednisone. He also wants something for pain. Please advise.   Copy and pasted from patient refill request through MyChart. See below.  Kieffer Blatz would like a refill of the following medications: cyclobenzaprine (FLEXERIL) 10 MG tablet Rodney Langton, MD] predniSONE (STERAPRED UNI-PAK) 10 MG tablet Rodney Langton, MD] Preferred pharmacy: [facility] - [place], [place] - [address]

## 2014-05-25 NOTE — Telephone Encounter (Signed)
Message copy and pasted from [employer]. See other message below.   this might be a dumb question but is it possible to give me something for pain? it's getting harder to do my job but someone is with me driving it is also getting ankying waking up most of the night thanks [employer]

## 2014-05-26 NOTE — Telephone Encounter (Signed)
He and I had already discussed this, i had added gabapentin.  He needs to stick with the up-taper.

## 2014-06-01 ENCOUNTER — Encounter (INDEPENDENT_AMBULATORY_CARE_PROVIDER_SITE_OTHER): Payer: BC Managed Care – PPO | Admitting: Physical Therapy

## 2014-06-01 DIAGNOSIS — M2560 Stiffness of unspecified joint, not elsewhere classified: Secondary | ICD-10-CM

## 2014-06-01 DIAGNOSIS — M2550 Pain in unspecified joint: Secondary | ICD-10-CM

## 2014-06-01 DIAGNOSIS — M542 Cervicalgia: Secondary | ICD-10-CM | POA: Diagnosis not present

## 2014-06-01 DIAGNOSIS — R5381 Other malaise: Secondary | ICD-10-CM

## 2014-06-01 DIAGNOSIS — M5412 Radiculopathy, cervical region: Secondary | ICD-10-CM | POA: Diagnosis not present

## 2014-06-03 ENCOUNTER — Encounter: Payer: BC Managed Care – PPO | Admitting: Family Medicine

## 2014-06-06 ENCOUNTER — Encounter (INDEPENDENT_AMBULATORY_CARE_PROVIDER_SITE_OTHER): Payer: BC Managed Care – PPO | Admitting: Physical Therapy

## 2014-06-06 DIAGNOSIS — M2550 Pain in unspecified joint: Secondary | ICD-10-CM

## 2014-06-06 DIAGNOSIS — M542 Cervicalgia: Secondary | ICD-10-CM

## 2014-06-06 DIAGNOSIS — M5412 Radiculopathy, cervical region: Secondary | ICD-10-CM

## 2014-06-06 DIAGNOSIS — R5381 Other malaise: Secondary | ICD-10-CM

## 2014-06-06 DIAGNOSIS — M2560 Stiffness of unspecified joint, not elsewhere classified: Secondary | ICD-10-CM

## 2014-06-08 ENCOUNTER — Encounter (INDEPENDENT_AMBULATORY_CARE_PROVIDER_SITE_OTHER): Payer: BC Managed Care – PPO | Admitting: Physical Therapy

## 2014-06-08 DIAGNOSIS — M2560 Stiffness of unspecified joint, not elsewhere classified: Secondary | ICD-10-CM

## 2014-06-08 DIAGNOSIS — M2550 Pain in unspecified joint: Secondary | ICD-10-CM

## 2014-06-08 DIAGNOSIS — M542 Cervicalgia: Secondary | ICD-10-CM

## 2014-06-08 DIAGNOSIS — M5412 Radiculopathy, cervical region: Secondary | ICD-10-CM

## 2014-06-08 DIAGNOSIS — R5381 Other malaise: Secondary | ICD-10-CM

## 2014-06-09 ENCOUNTER — Other Ambulatory Visit: Payer: Self-pay | Admitting: Sports Medicine

## 2014-06-09 DIAGNOSIS — M5412 Radiculopathy, cervical region: Secondary | ICD-10-CM

## 2014-06-09 MED ORDER — CYCLOBENZAPRINE HCL 10 MG PO TABS: ORAL_TABLET | ORAL | Status: DC

## 2014-06-13 ENCOUNTER — Encounter (INDEPENDENT_AMBULATORY_CARE_PROVIDER_SITE_OTHER): Payer: BC Managed Care – PPO | Admitting: Physical Therapy

## 2014-06-13 DIAGNOSIS — M2560 Stiffness of unspecified joint, not elsewhere classified: Secondary | ICD-10-CM

## 2014-06-13 DIAGNOSIS — R5381 Other malaise: Secondary | ICD-10-CM

## 2014-06-13 DIAGNOSIS — M542 Cervicalgia: Secondary | ICD-10-CM

## 2014-06-13 DIAGNOSIS — M5412 Radiculopathy, cervical region: Secondary | ICD-10-CM

## 2014-06-13 DIAGNOSIS — M2550 Pain in unspecified joint: Secondary | ICD-10-CM

## 2014-06-15 ENCOUNTER — Encounter (INDEPENDENT_AMBULATORY_CARE_PROVIDER_SITE_OTHER): Payer: BC Managed Care – PPO | Admitting: Physical Therapy

## 2014-06-15 DIAGNOSIS — M542 Cervicalgia: Secondary | ICD-10-CM

## 2014-06-15 DIAGNOSIS — M2560 Stiffness of unspecified joint, not elsewhere classified: Secondary | ICD-10-CM

## 2014-06-15 DIAGNOSIS — R5381 Other malaise: Secondary | ICD-10-CM

## 2014-06-15 DIAGNOSIS — M2550 Pain in unspecified joint: Secondary | ICD-10-CM

## 2014-06-15 DIAGNOSIS — M5412 Radiculopathy, cervical region: Secondary | ICD-10-CM

## 2014-06-15 NOTE — Telephone Encounter (Signed)
Gabriel Fus, Do you have any specific recommendations since I think this relates to his cervical radiculitis? Otherwise I will just provide him with a rough estimate of limitations.

## 2014-06-15 NOTE — Telephone Encounter (Signed)
I would recommend no lifting over 10 lbs, no prolonged up or downgaze, no overhead activity.  That's about it so far.

## 2014-06-15 NOTE — Telephone Encounter (Signed)
Patient walked-in office advised that he needs a note for work with a specific weight limit of what he can and can not lift. Patient states he can come pick up the note to please call him when ready. [phone]. Thanks

## 2014-06-15 NOTE — Telephone Encounter (Signed)
Printed and mailed to patient as per his request.

## 2014-06-16 ENCOUNTER — Other Ambulatory Visit (HOSPITAL_COMMUNITY): Payer: Self-pay | Admitting: Psychiatry

## 2014-06-20 ENCOUNTER — Encounter (INDEPENDENT_AMBULATORY_CARE_PROVIDER_SITE_OTHER): Payer: BC Managed Care – PPO | Admitting: Physical Therapy

## 2014-06-20 ENCOUNTER — Ambulatory Visit (HOSPITAL_COMMUNITY): Payer: Self-pay | Admitting: Psychiatry

## 2014-06-20 DIAGNOSIS — M2560 Stiffness of unspecified joint, not elsewhere classified: Secondary | ICD-10-CM

## 2014-06-20 DIAGNOSIS — M5412 Radiculopathy, cervical region: Secondary | ICD-10-CM

## 2014-06-20 DIAGNOSIS — M2550 Pain in unspecified joint: Secondary | ICD-10-CM

## 2014-06-20 DIAGNOSIS — R5381 Other malaise: Secondary | ICD-10-CM

## 2014-06-20 DIAGNOSIS — M542 Cervicalgia: Secondary | ICD-10-CM

## 2014-06-20 NOTE — Telephone Encounter (Signed)
Return phone call to pt and informed pt his prescription for Lexapro was refilled today. Confirmed future appt 10/2  with pt. Pt states and shows understanding.

## 2014-06-20 NOTE — Telephone Encounter (Signed)
Pt called this morning requesting a prescription refill for Lexapro 10 mg. Called pharmacy and authorized 1 refill for Lexapro  for pt. per Dr. Gilmore Laroche. Called and confirmed with pt next appt is 10/2 and pt confirm he will be at next appt. Pt states and shows understanding.

## 2014-06-22 ENCOUNTER — Encounter (INDEPENDENT_AMBULATORY_CARE_PROVIDER_SITE_OTHER): Payer: BC Managed Care – PPO | Admitting: Physical Therapy

## 2014-06-22 DIAGNOSIS — M5412 Radiculopathy, cervical region: Secondary | ICD-10-CM

## 2014-06-22 DIAGNOSIS — M259 Joint disorder, unspecified: Secondary | ICD-10-CM

## 2014-06-22 DIAGNOSIS — R5381 Other malaise: Secondary | ICD-10-CM

## 2014-06-22 DIAGNOSIS — M2560 Stiffness of unspecified joint, not elsewhere classified: Secondary | ICD-10-CM

## 2014-06-22 DIAGNOSIS — M542 Cervicalgia: Secondary | ICD-10-CM

## 2014-06-24 ENCOUNTER — Ambulatory Visit (HOSPITAL_COMMUNITY): Payer: Self-pay | Admitting: Psychiatry

## 2014-06-27 ENCOUNTER — Encounter (HOSPITAL_COMMUNITY): Payer: Self-pay | Admitting: Psychiatry

## 2014-06-27 ENCOUNTER — Ambulatory Visit (INDEPENDENT_AMBULATORY_CARE_PROVIDER_SITE_OTHER): Payer: BC Managed Care – PPO | Admitting: Psychiatry

## 2014-06-27 ENCOUNTER — Ambulatory Visit: Payer: Self-pay | Admitting: Sports Medicine

## 2014-06-27 VITALS — BP 132/90 | HR 96 | Ht 69.0 in | Wt 175.0 lb

## 2014-06-27 DIAGNOSIS — F4010 Social phobia, unspecified: Secondary | ICD-10-CM

## 2014-06-27 DIAGNOSIS — F411 Generalized anxiety disorder: Secondary | ICD-10-CM

## 2014-06-27 DIAGNOSIS — F418 Other specified anxiety disorders: Secondary | ICD-10-CM

## 2014-06-27 DIAGNOSIS — F329 Major depressive disorder, single episode, unspecified: Secondary | ICD-10-CM

## 2014-06-27 MED ORDER — ESCITALOPRAM OXALATE 10 MG PO TABS: 10.0000 mg | ORAL_TABLET | Freq: Every day | ORAL | Status: DC

## 2014-06-27 NOTE — Progress Notes (Signed)
Patient ID: Gabriel Shelton, male   DOB: 01-Mar-1981, 33 y.o.   MRN: [id number] Gabriel Shelton 33 y.o.  06/27/2014  Chief Complaint: depression and anxiety.     History of Present Illness:   Patient returns for Medication Follow up and is diagnosed with Major depressive disorder,  social anxiety disorder and generalized anxiety disorder.  He continues to do reasonable with Lexapro. Endorses some panic like symptoms but not every day. Depression was improved there is no reported side effects. He's not on tramadol anymore. He concerns about his pain for which she is follow up with his providers  Triggers include being alone or difficulty dealing with social norms. He is showing improvement in his symptoms with Lexapro with no reported side effects. He does get anxious when he is by himself but at work he likes it and he keep himself busy.  Modifying factors include medications and hobbies. 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 Duration of depression and anxiety since age 33.  Severity of depression 8/10. 10 being not depressed.  Severity of anxiety 5/10. 10 being severely anxious. Next next  Endorses having panic like symptoms including sweating palpitations when he is in an uncomfortable situation or crowd. He carries a small dose of Xanax with him that helps him in case he is having a panic attack. Has not used xanax in last week.  No other specific triggers for panic attacks. Sometimes he has a panic attack out of the blue. He continues to work at [employer] daily full-time his symptoms have not caused him any decrease in functional ability.   There is no associated symptoms of psychosis there is no prior history of mania. Patient also denies use of any illicit drugs or alcohol.       Past Medical History  Diagnosis Date  . Anxiety   . Depression   . GERD (gastroesophageal reflux disease)    Family History  Problem  Relation Age of Onset  . Bipolar disorder Neg Hx   . Depression Neg Hx     Outpatient Encounter Prescriptions as of 06/27/2014  Medication Sig  . ALPRAZolam (XANAX) 0.25 MG tablet Take 1 tablet (0.25 mg total) by mouth daily as needed for anxiety.  . cyclobenzaprine (FLEXERIL) 10 MG tablet One half tab PO qHS, then increase gradually to one tab TID.  Marland Kitchen escitalopram (LEXAPRO) 10 MG tablet Take 1 tablet (10 mg total) by mouth daily.  Marland Kitchen gabapentin (NEURONTIN) 300 MG capsule One tab PO qHS for a week, then BID for a week, then TID. May double weekly to a max of 3,600mg /day  . meloxicam (MOBIC) 15 MG tablet One tab PO qAM with breakfast for 2 weeks, then daily prn pain.  . Pantoprazole Sodium (PROTONIX PO) Take by mouth.  . [DISCONTINUED] escitalopram (LEXAPRO) 10 MG tablet take 1 tablet by mouth once daily  . HYDROcodone-acetaminophen (NORCO/VICODIN) 5-325 MG per tablet Take one by mouth at bedtime as needed for pain  . predniSONE (STERAPRED UNI-PAK) 10 MG tablet 12 day taper pack, use as directed    No results found for this or any previous visit (from the past 2160 hour(s)).  BP 157/89  Pulse 115  Ht 5\' 9"  (1.753 m)  Wt 175 lb (79.379 kg)  BMI 25.83 kg/m2   Review of Systems  Constitutional: Negative.   Respiratory: Negative for cough.   Neurological: Negative for dizziness.  Psychiatric/Behavioral: Negative for suicidal ideas, hallucinations and substance abuse.  The patient is nervous/anxious. The patient does not have insomnia.     Mental Status Examination  Appearance: casual Alert: Yes Attention: fair  Cooperative: Yes Eye Contact: Good Speech: coherent Psychomotor Activity: Decreased Memory/Concentration: adequate Oriented: person, place and time/date Mood: Euthymic Affect: Constricted Thought Processes and Associations: Coherent Fund of Knowledge: Fair Thought Content: Suicidal ideation and Homicidal ideation were denied. Insight: Fair Judgement:  Fair  Diagnosis: Maj. depressive disorder. Generalized anxiety disorder and social anxiety disorder  Treatment Plan:   Continue Lexapro at a dose of 10 mg he is responding better. Still some anxiety but not limited.  Pertinent Labs and Relevant Prior Notes reviewed. Medication Side effects, benefits and risks reviewed/discussed with Patient. Time given for patient to respond and asks questions regarding the Diagnosis and Medications. Safety concerns and to report to ER if suicidal or call 911. Relevant Medications refilled or called in to pharmacy. Discussed weight maintenance and Sleep Hygiene. Follow up with Primary care provider in regards to Medical conditions. Recommend compliance with medications and follow up office appointments. Discussed to avail opportunity to consider or/and continue Individual therapy with Counselor. Greater than 50% of time was spend in counseling and coordination of care with the patient.  Schedule for Follow up visit in 2 months or call in earlier as necessary.  Thresa RossAKHTAR, Jerzy Crotteau, MD 06/27/2014

## 2014-06-29 ENCOUNTER — Encounter: Payer: Self-pay | Admitting: Sports Medicine

## 2014-06-29 ENCOUNTER — Ambulatory Visit (INDEPENDENT_AMBULATORY_CARE_PROVIDER_SITE_OTHER): Payer: BC Managed Care – PPO | Admitting: Sports Medicine

## 2014-06-29 VITALS — BP 130/88 | HR 81 | Ht 69.0 in | Wt 172.0 lb

## 2014-06-29 DIAGNOSIS — M7522 Bicipital tendinitis, left shoulder: Secondary | ICD-10-CM

## 2014-06-29 NOTE — Progress Notes (Signed)
  Subjective:    CC: Follow up  HPI: Left cervical radiculitis: Resolved with physical therapy, gabapentin, and some chiropractic treatment.  Left elbow pain: Localized deep within the joint, moderate, persistent without radiation, no trauma.  Past medical history, Surgical history, Family history not pertinant except as noted below, Social history, Allergies, and medications have been entered into the medical record, reviewed, and no changes needed.   Review of Systems: No fevers, chills, night sweats, weight loss, chest pain, or shortness of breath.   Objective:    General: Well Developed, well nourished, and in no acute distress.  Neuro: Alert and oriented x3, extra-ocular muscles intact, sensation grossly intact.  HEENT: Normocephalic, atraumatic, pupils equal round reactive to light, neck supple, no masses, no lymphadenopathy, thyroid nonpalpable.  Skin: Warm and dry, no rashes. Cardiac: Regular rate and rhythm, no murmurs rubs or gallops, no lower extremity edema.  Respiratory: Clear to auscultation bilaterally. Not using accessory muscles, speaking in full sentences. Left Elbow: Unremarkable to inspection. Range of motion full pronation, supination, flexion, extension. Strength is full to all of the above directions There is reproduction of pain with resisted flexion and resisted supination. Stable to varus, valgus stress. Negative moving valgus stress test. No discrete areas of tenderness to palpation. Ulnar nerve does not sublux. Negative cubital tunnel Tinel's.  The elbow was strrapped with compressive dressing.  Impression and Recommendations:

## 2014-06-29 NOTE — Assessment & Plan Note (Signed)
With pain deep within the elbow joint and reproduction of pain resisted flexion and supination this likely represents distal biceps tendinitis. Referral to physical therapy for kinesiotaping. Continue NSAIDs, rehabilitation exercises given. Return in 4 weeks, injection versus MRI to evaluate if no better.

## 2014-07-04 ENCOUNTER — Ambulatory Visit (INDEPENDENT_AMBULATORY_CARE_PROVIDER_SITE_OTHER): Payer: BC Managed Care – PPO | Admitting: Physical Therapy

## 2014-07-04 DIAGNOSIS — M7522 Bicipital tendinitis, left shoulder: Secondary | ICD-10-CM

## 2014-07-04 DIAGNOSIS — M25539 Pain in unspecified wrist: Secondary | ICD-10-CM

## 2014-07-27 ENCOUNTER — Ambulatory Visit: Payer: Self-pay | Admitting: Sports Medicine

## 2014-07-27 DIAGNOSIS — Z0289 Encounter for other administrative examinations: Secondary | ICD-10-CM

## 2014-08-29 ENCOUNTER — Ambulatory Visit (HOSPITAL_COMMUNITY): Payer: Self-pay | Admitting: Psychiatry

## 2014-08-30 ENCOUNTER — Ambulatory Visit (HOSPITAL_COMMUNITY): Payer: Self-pay | Admitting: Psychiatry

## 2014-09-20 ENCOUNTER — Encounter: Payer: Self-pay | Admitting: Family Medicine

## 2014-09-26 ENCOUNTER — Encounter: Payer: Self-pay | Admitting: Family Medicine

## 2014-09-26 ENCOUNTER — Ambulatory Visit (INDEPENDENT_AMBULATORY_CARE_PROVIDER_SITE_OTHER): Payer: BLUE CROSS/BLUE SHIELD | Admitting: Family Medicine

## 2014-09-26 ENCOUNTER — Ambulatory Visit: Payer: Self-pay | Admitting: Family Medicine

## 2014-09-26 VITALS — BP 130/83 | HR 72 | Ht 69.0 in | Wt 171.0 lb

## 2014-09-26 DIAGNOSIS — Z23 Encounter for immunization: Secondary | ICD-10-CM

## 2014-09-26 DIAGNOSIS — Z0000 Encounter for general adult medical examination without abnormal findings: Secondary | ICD-10-CM

## 2014-09-26 LAB — COMPLETE METABOLIC PANEL WITH GFR
ALBUMIN: 4.6 g/dL (ref 3.5–5.2)
ALT: 24 U/L (ref 0–53)
AST: 19 U/L (ref 0–37)
Alkaline Phosphatase: 80 U/L (ref 39–117)
BUN: 15 mg/dL (ref 6–23)
CALCIUM: 9.9 mg/dL (ref 8.4–10.5)
CHLORIDE: 103 meq/L (ref 96–112)
CO2: 30 meq/L (ref 19–32)
Creat: 0.9 mg/dL (ref 0.50–1.35)
Glucose, Bld: 86 mg/dL (ref 70–99)
POTASSIUM: 4.6 meq/L (ref 3.5–5.3)
SODIUM: 140 meq/L (ref 135–145)
TOTAL PROTEIN: 7.3 g/dL (ref 6.0–8.3)
Total Bilirubin: 0.3 mg/dL (ref 0.2–1.2)

## 2014-09-26 LAB — CBC
HEMATOCRIT: 46.9 % (ref 39.0–52.0)
Hemoglobin: 15.8 g/dL (ref 13.0–17.0)
MCH: 30.3 pg (ref 26.0–34.0)
MCHC: 33.7 g/dL (ref 30.0–36.0)
MCV: 89.8 fL (ref 78.0–100.0)
MPV: 9 fL (ref 8.6–12.4)
Platelets: 382 10*3/uL (ref 150–400)
RBC: 5.22 MIL/uL (ref 4.22–5.81)
RDW: 13.5 % (ref 11.5–15.5)
WBC: 6.8 10*3/uL (ref 4.0–10.5)

## 2014-09-26 LAB — LIPID PANEL
Cholesterol: 196 mg/dL (ref 0–200)
HDL: 51 mg/dL (ref >39)
LDL Cholesterol: 110 mg/dL — ABNORMAL HIGH (ref 0–99)
Total CHOL/HDL Ratio: 3.8 Ratio
Triglycerides: 173 mg/dL — ABNORMAL HIGH (ref <150)
VLDL: 35 mg/dL (ref 0–40)

## 2014-09-26 NOTE — Progress Notes (Signed)
CC: Gabriel Shelton is a 34 y.o. male is here for Annual Exam   Subjective: HPI:  Colonoscopy: No  Family history of colon cancer, will begin screening at age 60 Prostate: Discussed screening risks/beneifts with patient during today's visit. No family history of prostate cancer will begin screening at age 33  Influenza Vaccine: Recieved in Nov Pneumovax:  No current ndication Td/Tdap:  Overdue for Tdap,  Will receive today Zoster: (Start 34 yo)   cutting back on smoking with using vapor nicotine, occasional alcohol use no recreational drug use  Review of Systems - General ROS: negative for - chills, fever, night sweats, weight gain or weight loss Ophthalmic ROS: negative for - decreased vision Psychological ROS: negative for - anxiety or depression ENT ROS: negative for - hearing change, nasal congestion, tinnitus or allergies Hematological and Lymphatic ROS: negative for - bleeding problems, bruising or swollen lymph nodes Breast ROS: negative Respiratory ROS: no cough, shortness of breath, or wheezing Cardiovascular ROS: no chest pain or dyspnea on exertion Gastrointestinal ROS: no abdominal pain, change in bowel habits, or black or bloody stools Genito-Urinary ROS: negative for - genital discharge, genital ulcers, incontinence or abnormal bleeding from genitals Musculoskeletal ROS: negative for - joint pain or muscle pain Neurological ROS: negative for - headaches or memory loss Dermatological ROS: negative for lumps, mole changes, rash and skin lesion changes  Past Medical History  Diagnosis Date  . Anxiety   . Depression   . GERD (gastroesophageal reflux disease)     Past Surgical History  Procedure Laterality Date  . Inner ear surgery    . Lithotripsy     Family History  Problem Relation Age of Onset  . Bipolar disorder Neg Hx   . Depression Neg Hx     History   Social History  . Marital Status: Single    Spouse Name: N/A    Number of Children: N/A  . Years of  Education: N/A   Occupational History  . Not on file.   Social History Main Topics  . Smoking status: Current Every Day Smoker -- 1.00 packs/day for 12 years  . Smokeless tobacco: Never Used  . Alcohol Use: No  . Drug Use: No  . Sexual Activity: Yes   Other Topics Concern  . Not on file   Social History Narrative     Objective: BP 130/83 mmHg  Pulse 72  Ht  (1.753 m)  Wt 171 lb (77.565 kg)  BMI 25.24 kg/m2  General: No Acute Distress HEENT: Atraumatic, normocephalic, conjunctivae normal without scleral icterus.  No nasal discharge, hearing grossly intact but decreased on the left, TM with good landmarks on the right with a green tympanostomy tube. no middle ear abnormalities on the right. Left tympanic membrane appears intact with moderate scar tissue on the tympanic membranes and middle ear. Left middle ear appears open., posterior pharynx clear without oral lesions. Neck: Supple, trachea midline, no cervical nor supraclavicular adenopathy. Pulmonary: Clear to auscultation bilaterally without wheezing, rhonchi, nor rales. Cardiac: Regular rate and rhythm.  No murmurs, rubs, nor gallops. No peripheral edema.  2+ peripheral pulses bilaterally. Abdomen: Bowel sounds normal.  No masses.  Non-tender without rebound.  Negative Murphy's sign. MSK: Grossly intact, no signs of weakness.  Full strength throughout upper and lower extremities.  Full ROM in upper and lower extremities.  No midline spinal tenderness. Neuro: Gait unremarkable, CN II-XII grossly intact.  C5-C6 Reflex 2/4 Bilaterally, L4 Reflex 2/4 Bilaterally.  Cerebellar function intact. Skin:  No rashes. Psych: Alert and oriented to person/place/time.  Thought process normal. No anxiety/depression.  Assessment & Plan: Gabriel Shelton was seen today for annual exam.  Diagnoses and associated orders for this visit:  Annual physical exam - Lipid panel - COMPLETE METABOLIC PANEL WITH GFR - CBC   Healthy lifestyle interventions  including but not limited to regular exercise, a healthy low fat diet, moderation of salt intake, the dangers of tobacco/alcohol/recreational drug use, nutrition supplementation, and accident avoidance were discussed with the patient and a handout was provided for future reference.  He's going to see his ear nose and throat physician at [facility] later today for follow-up of his Hearing loss and history of cholesteatoma of the left ear.  Return for Annual visits and as needed follow up.Marland Kitchen

## 2014-09-26 NOTE — Patient Instructions (Signed)
Dr. August Gosser's General Advice Following Your Complete Physical Exam  The Benefits of Regular Exercise: Unless you suffer from an uncontrolled cardiovascular condition, studies strongly suggest that regular exercise and physical activity will add to both the quality and length of your life.  The World Health Organization recommends 150 minutes of moderate intensity aerobic activity every week.  This is best split over 3-4 days a week, and can be as simple as a brisk walk for just over 35 minutes "most days of the week".  This type of exercise has been shown to lower LDL-Cholesterol, lower average blood sugars, lower blood pressure, lower cardiovascular disease risk, improve memory, and increase one's overall sense of wellbeing.  The addition of anaerobic (or "strength training") exercises offers additional benefits including but not limited to increased metabolism, prevention of osteoporosis, and improved overall cholesterol levels.  How Can I Strive For A Low-Fat Diet?: Current guidelines recommend that 25-35 percent of your daily energy (food) intake should come from fats.  One might ask how can this be achieved without having to dissect each meal on a daily basis?  Switch to skim or 1% milk instead of whole milk.  Focus on lean meats such as ground turkey, fresh fish, baked chicken, and lean cuts of beef as your source of dietary protein.  Limit saturated fat consumption to less than 10% of your daily caloric intake.  Limit trans fatty acid consumption primarily by limiting synthetic trans fats such as partially hydrogenated oils (Ex: fried fast foods).  Substitute olive or vegetable oil for solid fats where possible.  Moderation of Salt Intake: Provided you don't carry a diagnosis of congestive heart failure nor renal failure, I recommend a daily allowance of no more than 2300 mg of salt (sodium).  Keeping under this daily goal is associated with a decreased risk of cardiovascular events, creeping  above it can lead to elevated blood pressures and increases your risk of cardiovascular events.  Milligrams (mg) of salt is listed on all nutrition labels, and your daily intake can add up faster than you think.  Most canned and frozen dinners can pack in over half your daily salt allowance in one meal.    Lifestyle Health Risks: Certain lifestyle choices carry specific health risks.  As you may already know, tobacco use has been associated with increasing one's risk of cardiovascular disease, pulmonary disease, numerous cancers, among many other issues.  What you may not know is that there are medications and nicotine replacement strategies that can more than double your chances of successfully quitting.  I would be thrilled to help manage your quitting strategy if you currently use tobacco products.  When it comes to alcohol use, I've yet to find an "ideal" daily allowance.  Provided an individual does not have a medical condition that is exacerbated by alcohol consumption, general guidelines determine "safe drinking" as no more than two standard drinks for a man or no more than one standard drink for a male per day.  However, much debate still exists on whether any amount of alcohol consumption is technically "safe".  My general advice, keep alcohol consumption to a minimum for general health promotion.  If you or others believe that alcohol, tobacco, or recreational drug use is interfering with your life, I would be happy to provide confidential counseling regarding treatment options.  General "Over The Counter" Nutrition Advice: Postmenopausal women should aim for a daily calcium intake of 1200 mg, however a significant portion of this might already be   provided by diets including milk, yogurt, cheese, and other dairy products.  Vitamin D has been shown to help preserve bone density, prevent fatigue, and has even been shown to help reduce falls in the elderly.  Ensuring a daily intake of 800 Units of  Vitamin D is a good place to start to enjoy the above benefits, we can easily check your Vitamin D level to see if you'd potentially benefit from supplementation beyond 800 Units a day.  Folic Acid intake should be of particular concern to women of childbearing age.  Daily consumption of 400-800 mcg of Folic Acid is recommended to minimize the chance of spinal cord defects in a fetus should pregnancy occur.    For many adults, accidents still remain one of the most common culprits when it comes to cause of death.  Some of the simplest but most effective preventitive habits you can adopt include regular seatbelt use, proper helmet use, securing firearms, and regularly testing your smoke and carbon monoxide detectors.  Gabriel Derosia B. Margaux Engen DO [facility] [address], [address] [place], [place] [address] Phone: [phone]  

## 2014-09-27 ENCOUNTER — Telehealth: Payer: Self-pay | Admitting: Family Medicine

## 2014-09-27 DIAGNOSIS — E785 Hyperlipidemia, unspecified: Secondary | ICD-10-CM | POA: Insufficient documentation

## 2014-09-27 MED ORDER — FISH OIL 1000 MG PO CAPS: ORAL_CAPSULE | ORAL | Status: DC

## 2014-09-27 NOTE — Telephone Encounter (Signed)
Pt.notified

## 2014-09-27 NOTE — Telephone Encounter (Signed)
Sue Lushndrea, Will you please let patient know that his kidney function, liver function, blood sugar, and blood cell counts were all normal. LDL cholesterol and triglycerides were mildly elevated. This can be improved with engaging in 30-45 minutes of moderate exercise most days of the week and/or taking a 1g fish oil capsule twice a day.  I'd recommend he return in 6 months to have this rechecked.

## 2014-10-10 ENCOUNTER — Ambulatory Visit (HOSPITAL_COMMUNITY): Payer: Self-pay | Admitting: Psychiatry

## 2014-10-11 ENCOUNTER — Ambulatory Visit (HOSPITAL_COMMUNITY): Payer: Self-pay | Admitting: Psychiatry

## 2014-10-11 ENCOUNTER — Telehealth (HOSPITAL_COMMUNITY): Payer: Self-pay | Admitting: *Deleted

## 2014-10-11 DIAGNOSIS — F411 Generalized anxiety disorder: Secondary | ICD-10-CM

## 2014-10-11 DIAGNOSIS — F4010 Social phobia, unspecified: Secondary | ICD-10-CM

## 2014-10-11 MED ORDER — ESCITALOPRAM OXALATE 10 MG PO TABS: 10.0000 mg | ORAL_TABLET | Freq: Every day | ORAL | Status: DC

## 2014-10-11 NOTE — Telephone Encounter (Signed)
Pt called @ 1610. Pt is unable to leave work. Pt was informed his appt will have to be reschedule. Per Dr. Gilmore LarocheAkhtar, pt may have 1 refill for Lexapro 10mg , Qty 30 and f/u w/ office in 3 weeks. Pt has appt 2/9. Pt states and shows understanding.

## 2014-10-20 ENCOUNTER — Encounter: Payer: Self-pay | Admitting: Physical Therapy

## 2014-11-01 ENCOUNTER — Ambulatory Visit (HOSPITAL_COMMUNITY): Payer: Self-pay | Admitting: Psychiatry

## 2014-11-11 ENCOUNTER — Ambulatory Visit (HOSPITAL_COMMUNITY): Payer: Self-pay | Admitting: Psychiatry

## 2014-11-25 ENCOUNTER — Other Ambulatory Visit (HOSPITAL_COMMUNITY): Payer: Self-pay | Admitting: Psychiatry

## 2014-12-02 ENCOUNTER — Encounter (HOSPITAL_COMMUNITY): Payer: Self-pay | Admitting: Psychiatry

## 2014-12-02 ENCOUNTER — Encounter (HOSPITAL_COMMUNITY): Payer: Self-pay | Admitting: *Deleted

## 2015-01-25 ENCOUNTER — Ambulatory Visit: Payer: Self-pay | Admitting: Family Medicine

## 2015-03-02 ENCOUNTER — Encounter: Payer: Self-pay | Admitting: Family Medicine

## 2015-03-03 ENCOUNTER — Encounter: Payer: Self-pay | Admitting: *Deleted

## 2015-03-03 ENCOUNTER — Emergency Department (INDEPENDENT_AMBULATORY_CARE_PROVIDER_SITE_OTHER)
Admission: EM | Admit: 2015-03-03 | Discharge: 2015-03-03 | Disposition: A | Discharge status: Home / Self Care | Payer: BLUE CROSS/BLUE SHIELD | Source: Home / Self Care | Attending: Family Medicine | Admitting: Family Medicine

## 2015-03-03 DIAGNOSIS — B9789 Other viral agents as the cause of diseases classified elsewhere: Principal | ICD-10-CM

## 2015-03-03 DIAGNOSIS — J069 Acute upper respiratory infection, unspecified: Secondary | ICD-10-CM | POA: Diagnosis not present

## 2015-03-03 MED ORDER — BENZONATATE 200 MG PO CAPS: 200.0000 mg | ORAL_CAPSULE | Freq: Every day | ORAL | Status: DC

## 2015-03-03 MED ORDER — AMOXICILLIN 875 MG PO TABS: 875.0000 mg | ORAL_TABLET | Freq: Two times a day (BID) | ORAL | Status: DC

## 2015-03-03 MED ORDER — PREDNISONE 20 MG PO TABS: 20.0000 mg | ORAL_TABLET | Freq: Two times a day (BID) | ORAL | Status: DC

## 2015-03-03 NOTE — ED Notes (Signed)
Pt c/o awakening this AM with fever, aches, HA and sinus drainage. Fever 102 this AM. Taken tylenol. Prescribed eye gtts yesterday for eye infection.

## 2015-03-03 NOTE — ED Provider Notes (Signed)
CSN: [id number]     Arrival date & time 03/03/15  0805 History   First MD Initiated Contact with Patient 03/03/15 [phone]     Chief Complaint  Patient presents with  . Fever  . Generalized Body Aches      HPI Comments: Patient awoke yesterday with left eye redness and visited his PCP who started him on antibiotic eye drops.  Patient has a history of seasonal rhinitis and recalls having mild increase in sinus congestion about 4 days ago.  Today his sinus congestion increased significantly, and he awoke with mild sore throat, myalgias, dizziness, non-productive cough, fever/sweats, and fatigue. 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  He continues to smoke.  The history is provided by the patient.    Past Medical History  Diagnosis Date  . Anxiety   . Depression   . GERD (gastroesophageal reflux disease)    Past Surgical History  Procedure Laterality Date  . Inner ear surgery    . Lithotripsy     Family History  Problem Relation Age of Onset  . Bipolar disorder Neg Hx   . Depression Neg Hx    History  Substance Use Topics  . Smoking status: Current Every Day Smoker -- 1.00 packs/day for 12 years  . Smokeless tobacco: Never Used  . Alcohol Use: No    Review of Systems + sore throat + cough No pleuritic pain No wheezing + nasal congestion + post-nasal drainage No sinus pain/pressure + itchy/red left eye No earache, but ears feel full No hemoptysis No SOB + fever, + chills No nausea No vomiting No abdominal pain No diarrhea No urinary symptoms No skin rash + fatigue + myalgias No headache Used OTC meds without relief  Allergies  Ambien  Home Medications   Prior to Admission medications   Medication Sig Start Date End Date Taking? Authorizing Provider  ALPRAZolam (XANAX) 0.25 MG tablet Take 1 tablet (0.25 mg total) by mouth daily  as needed for anxiety. 05/23/14   Thresa Ross, MD  amoxicillin (AMOXIL) 875 MG tablet Take 1 tablet (875 mg total) by mouth 2 (two) times daily. 03/03/15   Lattie Haw, MD  benzonatate (TESSALON) 200 MG capsule Take 1 capsule (200 mg total) by mouth at bedtime. Take as needed for cough 03/03/15   Lattie Haw, MD  escitalopram (LEXAPRO) 10 MG tablet Take 1 tablet (10 mg total) by mouth daily. 10/11/14   Thresa Ross, MD  Omega-3 Fatty Acids (FISH OIL) 1000 MG CAPS One PO BID 09/27/14   Sean Hommel, DO  predniSONE (DELTASONE) 20 MG tablet Take 1 tablet (20 mg total) by mouth 2 (two) times daily. Take with food. 03/03/15   Lattie Haw, MD   BP 126/83 mmHg  Pulse 100  Temp(Src) 99.3 F (37.4 C) (Oral)  Resp 16  Wt 169 lb (76.658 kg)  SpO2 99% Physical Exam Nursing notes and Vital Signs reviewed. Appearance:  Patient appears stated age, and in no acute distress Eyes:  Pupils are equal, round, and reactive to light and accomodation.  Extraocular movement is intact.  Conjunctivae are not inflamed  Ears:  Canals normal.  Right tympanic membrane normal with ventilation tube in place (no drainage from tube).  Left tympanic membrane deformed; chronic perforation present. Nose:  Mildly congested turbinates.  No sinus tenderness.  Pharynx:  Minimal erythema. Neck:  Supple.  Tender enlarged posterior nodes are palpated bilaterally  Lungs:  Clear to auscultation.  Breath sounds are equal.  Heart:  Regular rate and rhythm without murmurs, rubs, or gallops.  Abdomen:  Nontender without masses or hepatosplenomegaly.  Bowel sounds are present.  No CVA or flank tenderness.  Extremities:  No edema.  No calf tenderness Skin:  No rash present.   ED Course  Procedures     MDM   1. Viral URI with cough    With a history of chronic otitis media and ventilation tubes, will begin prednisone burst and amoxicillin 875mg  BID Prescription written for Benzonatate (Tessalon) to take at bedtime for  night-time cough.  Take plain guaifenesin (1200mg  extended release tabs such as Mucinex) twice daily, with plenty of water, for cough and congestion.  May add Pseudoephedrine (30mg , one or two every 4 to 6 hours) for sinus congestion.  Get adequate rest.   May use Afrin nasal spray (or generic oxymetazoline) twice daily for about 5 days.  Also recommend using saline nasal spray several times daily and saline nasal irrigation (AYR is a common brand).  Use Flonase nasal spray each morning after using Afrin nasal spray and saline nasal irrigation. Try warm salt water gargles for sore throat.  Stop all antihistamines for now, and other non-prescription cough/cold preparations.  Follow-up with family doctor if not improving about10 days.    Lattie Haw, MD 03/03/15 [phone]

## 2015-03-03 NOTE — Discharge Instructions (Signed)
Take plain guaifenesin (1200mg extended release tabs such as Mucinex) twice daily, with plenty of water, for cough and congestion.  May add Pseudoephedrine (30mg, one or two every 4 to 6 hours) for sinus congestion.  Get adequate rest.   °May use Afrin nasal spray (or generic oxymetazoline) twice daily for about 5 days.  Also recommend using saline nasal spray several times daily and saline nasal irrigation (AYR is a common brand).  Use Flonase nasal spray each morning after using Afrin nasal spray and saline nasal irrigation. °Try warm salt water gargles for sore throat.  °Stop all antihistamines for now, and other non-prescription cough/cold preparations. °  °Follow-up with family doctor if not improving about10 days.  °

## 2015-03-20 ENCOUNTER — Ambulatory Visit: Payer: Self-pay | Admitting: Family Medicine

## 2015-10-11 ENCOUNTER — Encounter: Payer: Self-pay | Admitting: Family Medicine

## 2015-10-11 ENCOUNTER — Ambulatory Visit (INDEPENDENT_AMBULATORY_CARE_PROVIDER_SITE_OTHER): Payer: BLUE CROSS/BLUE SHIELD | Admitting: Family Medicine

## 2015-10-11 VITALS — BP 122/76 | HR 75 | Wt 165.0 lb

## 2015-10-11 DIAGNOSIS — Z0000 Encounter for general adult medical examination without abnormal findings: Secondary | ICD-10-CM | POA: Diagnosis not present

## 2015-10-11 LAB — COMPLETE METABOLIC PANEL WITH GFR
ALBUMIN: 4.7 g/dL (ref 3.6–5.1)
ALK PHOS: 81 U/L (ref 40–115)
ALT: 23 U/L (ref 9–46)
AST: 18 U/L (ref 10–40)
BILIRUBIN TOTAL: 0.6 mg/dL (ref 0.2–1.2)
BUN: 19 mg/dL (ref 7–25)
CALCIUM: 10.1 mg/dL (ref 8.6–10.3)
CO2: 29 mmol/L (ref 20–31)
CREATININE: 0.93 mg/dL (ref 0.60–1.35)
Chloride: 102 mmol/L (ref 98–110)
Glucose, Bld: 89 mg/dL (ref 65–99)
Potassium: 4.6 mmol/L (ref 3.5–5.3)
Sodium: 137 mmol/L (ref 135–146)
TOTAL PROTEIN: 7.6 g/dL (ref 6.1–8.1)

## 2015-10-11 LAB — CBC
HEMATOCRIT: 46.5 % (ref 39.0–52.0)
Hemoglobin: 16.2 g/dL (ref 13.0–17.0)
MCH: 31.6 pg (ref 26.0–34.0)
MCHC: 34.8 g/dL (ref 30.0–36.0)
MCV: 90.8 fL (ref 78.0–100.0)
MPV: 9 fL (ref 8.6–12.4)
Platelets: 376 10*3/uL (ref 150–400)
RBC: 5.12 MIL/uL (ref 4.22–5.81)
RDW: 13.5 % (ref 11.5–15.5)
WBC: 6 10*3/uL (ref 4.0–10.5)

## 2015-10-11 LAB — LIPID PANEL
CHOL/HDL RATIO: 3.2 ratio (ref <=5.0)
CHOLESTEROL: 158 mg/dL (ref 125–200)
HDL: 50 mg/dL (ref >=40)
LDL Cholesterol: 95 mg/dL (ref <130)
TRIGLYCERIDES: 65 mg/dL (ref <150)
VLDL: 13 mg/dL (ref <30)

## 2015-10-11 NOTE — Progress Notes (Signed)
CC: Gabriel Shelton is a 35 y.o. male is here for Annual Exam   Subjective: HPI:  Colonoscopy: No current indication for screening Prostate: Discussed screening risks/beneifts with patient today, no current indication for screening    Influenza Vaccine: Up-to-date Pneumovax: No current indication Td/Tdap: Up-to-date from last year Zoster: (Start 34 yo)  Requesting complete physical exam with no complaints  Review of Systems - General ROS: negative for - chills, fever, night sweats, weight gain or weight loss Ophthalmic ROS: negative for - decreased vision Psychological ROS: negative for - anxiety or depression ENT ROS: negative for - hearing change, nasal congestion, tinnitus or allergies Hematological and Lymphatic ROS: negative for - bleeding problems, bruising or swollen lymph nodes Breast ROS: negative Respiratory ROS: no cough, shortness of breath, or wheezing Cardiovascular ROS: no chest pain or dyspnea on exertion Gastrointestinal ROS: no abdominal pain, change in bowel habits, or black or bloody stools Genito-Urinary ROS: negative for - genital discharge, genital ulcers, incontinence or abnormal bleeding from genitals Musculoskeletal ROS: negative for - joint pain or muscle pain Neurological ROS: negative for - headaches or memory loss Dermatological ROS: negative for lumps, mole changes, rash and skin lesion changes  Past Medical History  Diagnosis Date  . Anxiety   . Depression   . GERD (gastroesophageal reflux disease)     Past Surgical History  Procedure Laterality Date  . Inner ear surgery    . Lithotripsy     Family History  Problem Relation Age of Onset  . Bipolar disorder Neg Hx   . Depression Neg Hx     Social History   Social History  . Marital Status: Single    Spouse Name: N/A  . Number of Children: N/A  . Years of Education: N/A   Occupational History  . Not on file.   Social History Main Topics  . Smoking status: Current Every Day Smoker  -- 1.00 packs/day for 12 years  . Smokeless tobacco: Never Used  . Alcohol Use: No  . Drug Use: No  . Sexual Activity: Yes   Other Topics Concern  . Not on file   Social History Narrative     Objective: BP 122/76 mmHg  Pulse 75  Wt 165 lb (74.844 kg)  General: No Acute Distress HEENT: Atraumatic, normocephalic, conjunctivae normal without scleral icterus.  No nasal discharge, hearing grossly intact, TMs with good landmarks bilaterally with no middle ear abnormalities, posterior pharynx clear without oral lesions. Neck: Supple, trachea midline, no cervical nor supraclavicular adenopathy. Pulmonary: Clear to auscultation bilaterally without wheezing, rhonchi, nor rales. Cardiac: Regular rate and rhythm.  No murmurs, rubs, nor gallops. No peripheral edema.  2+ peripheral pulses bilaterally. Abdomen: Bowel sounds normal.  No masses.  Non-tender without rebound.  Negative Murphy's sign. MSK: Grossly intact, no signs of weakness.  Full strength throughout upper and lower extremities.  Full ROM in upper and lower extremities.  No midline spinal tenderness. Neuro: Gait unremarkable, CN II-XII grossly intact.  C5-C6 Reflex 2/4 Bilaterally, L4 Reflex 2/4 Bilaterally.  Cerebellar function intact. Skin: No rashes. Psych: Alert and oriented to person/place/time.  Thought process normal. No anxiety/depression. Assessment & Plan: Gabriel Shelton was seen today for annual exam.  Diagnoses and all orders for this visit:  Annual physical exam -     Lipid panel -     CBC -     COMPLETE METABOLIC PANEL WITH GFR  Healthy lifestyle interventions including but not limited to regular exercise, a healthy low fat diet, moderation  of salt intake, the dangers of tobacco/alcohol/recreational drug use, nutrition supplementation, and accident avoidance were discussed with the patient and a handout was provided for future reference.   Return in about 1 year (around 10/10/2016) for Annual Physicals.

## 2016-01-24 ENCOUNTER — Emergency Department (INDEPENDENT_AMBULATORY_CARE_PROVIDER_SITE_OTHER)
Admission: EM | Admit: 2016-01-24 | Discharge: 2016-01-24 | Disposition: A | Discharge status: Home / Self Care | Payer: Self-pay | Source: Home / Self Care | Attending: Family Medicine | Admitting: Family Medicine

## 2016-01-24 ENCOUNTER — Encounter: Payer: Self-pay | Admitting: *Deleted

## 2016-01-24 ENCOUNTER — Emergency Department (INDEPENDENT_AMBULATORY_CARE_PROVIDER_SITE_OTHER): Payer: BLUE CROSS/BLUE SHIELD

## 2016-01-24 DIAGNOSIS — W1843XA Slipping, tripping and stumbling without falling due to stepping from one level to another, initial encounter: Secondary | ICD-10-CM | POA: Diagnosis not present

## 2016-01-24 DIAGNOSIS — S92355A Nondisplaced fracture of fifth metatarsal bone, left foot, initial encounter for closed fracture: Secondary | ICD-10-CM

## 2016-01-24 MED ORDER — IBUPROFEN 600 MG PO TABS
600.0000 mg | ORAL_TABLET | Freq: Once | ORAL | Status: AC
  Administered 2016-01-24: 600 mg via ORAL

## 2016-01-24 MED ORDER — HYDROCODONE-ACETAMINOPHEN 5-325 MG PO TABS: 1.0000 | ORAL_TABLET | Freq: Four times a day (QID) | ORAL | Status: DC | PRN

## 2016-01-24 NOTE — ED Notes (Signed)
Workers comp injury: pt reports that he stepped off of his [employer] truck, slipped on the curb and his foot went between the truck and the curb x 2:00pm today. No OTC meds. He reports that he has been unable to reach his "boss" and he is unsure if he needs a drug screen.

## 2016-01-24 NOTE — Discharge Instructions (Signed)
Apply ice pack for 30 minutes every 1 to 2 hours today and tomorrow.  Elevate.  Use crutches (avoid weight bearing).  Wear Ace wrap and Cam Walker.     Metatarsal Fracture A metatarsal fracture is a break in a metatarsal bone. Metatarsal bones connect your toe bones to your ankle bones. CAUSES This type of fracture may be caused by:  A sudden twisting of your foot.  A fall onto your foot.  Overuse or repetitive exercise. RISK FACTORS This condition is more likely to develop in people who:  Play contact sports.  Have a bone disease.  Have a low calcium level. SYMPTOMS Symptoms of this condition include:  Pain that is worse when walking or standing.  Pain when pressing on the foot or moving the toes.  Swelling.  Bruising on the top or bottom of the foot.  A foot that appears shorter than the other one. DIAGNOSIS This condition is diagnosed with a physical exam. You may also have imaging tests, such as:  X-rays.  A CT scan.  MRI. TREATMENT Treatment for this condition depends on its severity and whether a bone has moved out of place. Treatment may involve:  Rest.  Wearing foot support such as a cast, splint, or boot for several weeks.  Using crutches.  Surgery to move bones back into the right position. Surgery is usually needed if there are many pieces of broken bone or bones that are very out of place (displaced fracture).  Physical therapy. This may be needed to help you regain full movement and strength in your foot. You will need to return to your health care provider to have X-rays taken until your bones heal. Your health care provider will look at the X-rays to make sure that your foot is healing well. HOME CARE INSTRUCTIONS  If You Have a Cast:  Do not stick anything inside the cast to scratch your skin. Doing that increases your risk of infection.  Check the skin around the cast every day. Report any concerns to your health care provider. You may put  lotion on dry skin around the edges of the cast. Do not apply lotion to the skin underneath the cast.  Keep the cast clean and dry. If You Have a Splint or a Supportive Boot:  Wear it as directed by your health care provider. Remove it only as directed by your health care provider.  Loosen it if your toes become numb and tingle, or if they turn cold and blue.  Keep it clean and dry. Bathing  Do not take baths, swim, or use a hot tub until your health care provider approves. Ask your health care provider if you can take showers. You may only be allowed to take sponge baths for bathing.  If your health care provider approves bathing and showering, cover the cast or splint with a watertight plastic bag to protect it from water. Do not let the cast or splint get wet. Managing Pain, Stiffness, and Swelling  If directed, apply ice to the injured area (if you have a splint, not a cast).  Put ice in a plastic bag.  Place a towel between your skin and the bag.  Leave the ice on for 20 minutes, 2-3 times per day.  Move your toes often to avoid stiffness and to lessen swelling.  Raise (elevate) the injured area above the level of your heart while you are sitting or lying down. Driving  Do not drive or operate heavy machinery  while taking pain medicine.  Do not drive while wearing foot support on a foot that you use for driving. Activity  Return to your normal activities as directed by your health care provider. Ask your health care provider what activities are safe for you.  Perform exercises as directed by your health care provider or physical therapist. Safety  Do not use the injured foot to support your body weight until your health care provider says that you can. Use crutches as directed by your health care provider. General Instructions  Do not put pressure on any part of the cast or splint until it is fully hardened. This may take several hours.  Do not use any tobacco  products, including cigarettes, chewing tobacco, or e-cigarettes. Tobacco can delay bone healing. If you need help quitting, ask your health care provider.  Take medicines only as directed by your health care provider.  Keep all follow-up visits as directed by your health care provider. This is important. SEEK MEDICAL CARE IF:  You have a fever.  Your cast, splint, or boot is too loose or too tight.  Your cast, splint, or boot is damaged.  Your pain medicine is not helping.  You have pain, tingling, or numbness in your foot that is not going away. SEEK IMMEDIATE MEDICAL CARE IF:  You have severe pain.  You have tingling or numbness in your foot that is getting worse.  Your foot feels cold or becomes numb.  Your foot changes color.   This information is not intended to replace advice given to you by your health care provider. Make sure you discuss any questions you have with your health care provider.   Document Released: 06/01/2002 Document Revised: 01/24/2015 Document Reviewed: 07/06/2014 Elsevier Interactive Patient Education [employer]2016 [employer] Inc[employer]

## 2016-01-24 NOTE — ED Provider Notes (Signed)
CSN: [id number]     Arrival date & time 01/24/16  1833 History   First MD Initiated Contact with Patient 01/24/16 1917     Chief Complaint  Patient presents with  . Foot Injury      HPI Comments: Patient presents for occupational injury.  While stepping off his [employer] truck today, he slipped on curb and his left foot twisted between the curb and his truck's tire.  He has had persistent pain in his left lateral foot.  Patient is a 35 y.o. male presenting with foot injury. The history is provided by the patient.  Foot Injury Location:  Foot Time since incident:  4 hours Injury: yes   Mechanism of injury comment:  Twisted foot Foot location:  L foot Pain details:    Quality:  Aching   Radiates to:  Does not radiate   Severity:  Moderate   Onset quality:  Sudden   Timing:  Constant   Progression:  Worsening Chronicity:  New Dislocation: no   Foreign body present:  No foreign bodies Prior injury to area:  No Relieved by:  Nothing Worsened by:  Bearing weight Ineffective treatments:  None tried Associated symptoms: decreased ROM, stiffness and swelling   Associated symptoms: no back pain, no muscle weakness, no numbness and no tingling     Past Medical History  Diagnosis Date  . Anxiety   . Depression   . GERD (gastroesophageal reflux disease)    Past Surgical History  Procedure Laterality Date  . Inner ear surgery    . Lithotripsy     Family History  Problem Relation Age of Onset  . Bipolar disorder Neg Hx   . Depression Neg Hx    Social History  Substance Use Topics  . Smoking status: Current Every Day Smoker -- 1.00 packs/day for 12 years  . Smokeless tobacco: Never Used  . Alcohol Use: No    Review of Systems  Musculoskeletal: Positive for stiffness. Negative for back pain.  All other systems reviewed and are negative.   Allergies  Ambien  Home Medications   Prior to Admission medications   Medication Sig Start Date End Date Taking? Authorizing  Provider  HYDROcodone-acetaminophen (NORCO/VICODIN) 5-325 MG tablet Take 1 tablet by mouth every 6 (six) hours as needed for severe pain. 01/24/16   Lattie HawStephen A Natsuko Kelsay, MD   Meds Ordered and Administered this Visit   Medications  ibuprofen (ADVIL,MOTRIN) tablet 600 mg (600 mg Oral Given 01/24/16 1922)    BP 122/87 mmHg  Pulse 87  Temp(Src) 98.2 F (36.8 C) (Oral)  Resp 18  Ht 5\' 9"  (1.753 m)  Wt 170 lb (77.111 kg)  BMI 25.09 kg/m2  SpO2 97% No data found.   Physical Exam  Constitutional: He is oriented to person, place, and time. He appears well-developed and well-nourished. No distress.  HENT:  Head: Atraumatic.  Eyes: Pupils are equal, round, and reactive to light.  Musculoskeletal:       Left foot: There is decreased range of motion, tenderness, bony tenderness and swelling. There is normal capillary refill, no deformity and no laceration.       Feet:  There is tenderness to palpation and swelling over the dorsal lateral edge of the left foot, with point tenderness over the proximal fifth metatarsal.  Distal neurovascular function is intact.   Neurological: He is alert and oriented to person, place, and time.  Skin: Skin is warm and dry.  Nursing note and vitals reviewed.  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 EXAM: LEFT FOOT - COMPLETE 3+ VIEW COMPARISON:  None. FINDINGS: There is a nondisplaced anatomically aligned transverse fracture at the base of the fifth metatarsal. Overlying soft tissue swelling. No radiopaque foreign body IMPRESSION: Fifth metatarsal base fracture Electronically Signed   By: Ellery Plunk M.D.   On: 01/24/2016 18:59      MDM   1. Nondisplaced fracture of fifth left metatarsal bone, closed, initial encounter    Ace wrap and cam walker applied.  Dispensed crutches.  Rx for Lortab. Apply ice pack for 30 minutes  every 1 to 2 hours today and tomorrow.  Elevate.  Use crutches (avoid weight bearing).  Wear Ace wrap and Cam Walker.  Discussed with Dr. Rodney Langton  Will refer patient to Dr. Dewain Penning (orthopedist) for fracture management and follow-up   Work restrictions:  Light duty (No weight bearing).    Lattie Haw, MD 01/24/16 2021

## 2016-06-19 IMAGING — CR DG LUMBAR SPINE COMPLETE 4+V
5 series · 5 of 5 positions shown · non-contrast
Comparison: None.

CLINICAL DATA: Right-sided back pain after a recent fall.

EXAM:
LUMBAR SPINE - COMPLETE 4+ VIEW

[view not recorded (1 of 5)]
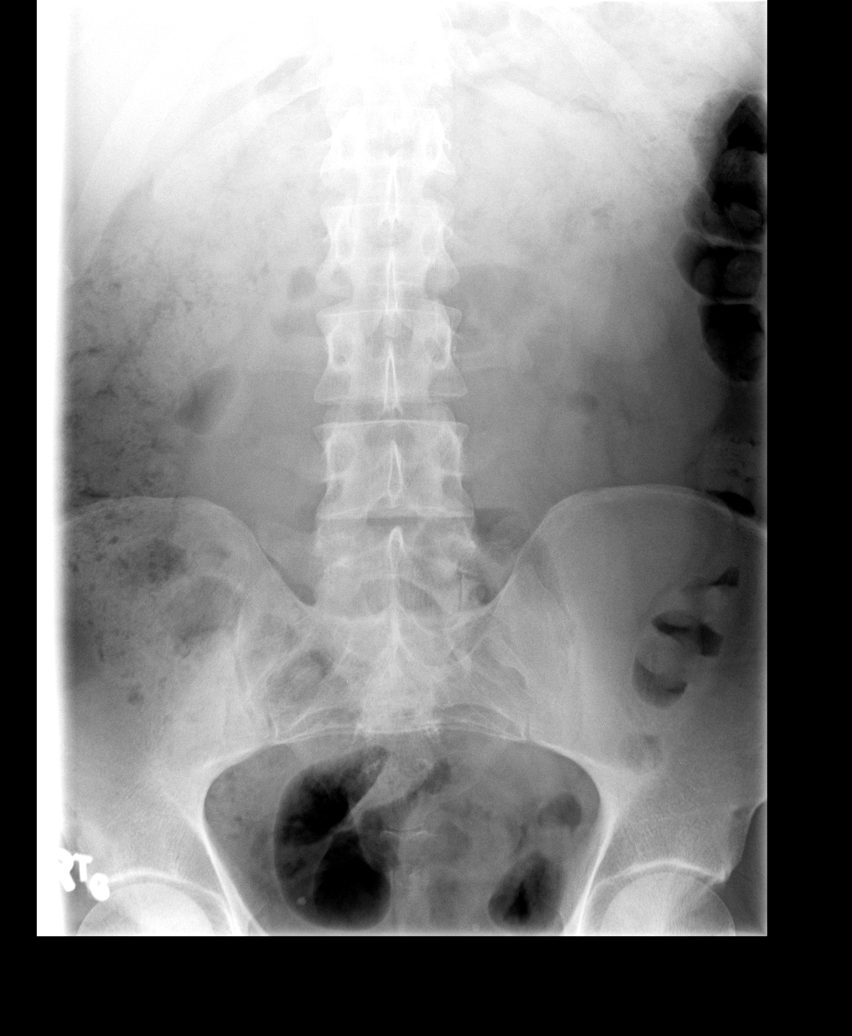

[view not recorded (2 of 5)]
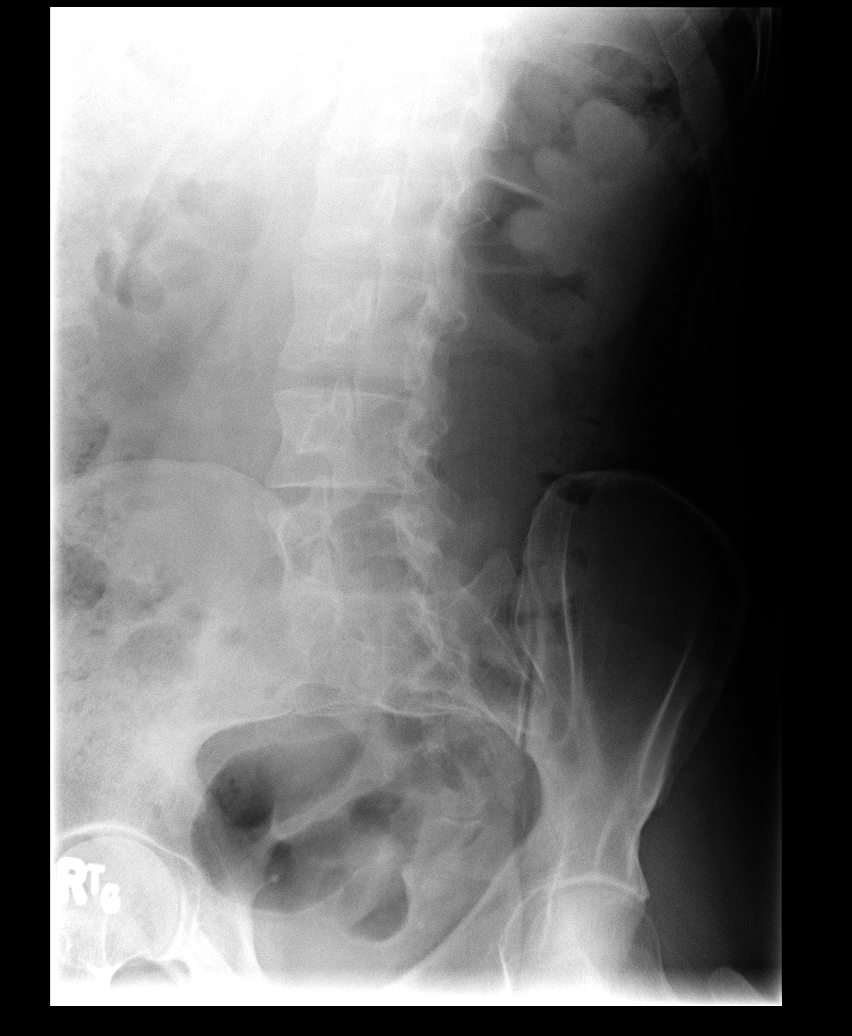

[view not recorded (3 of 5)]
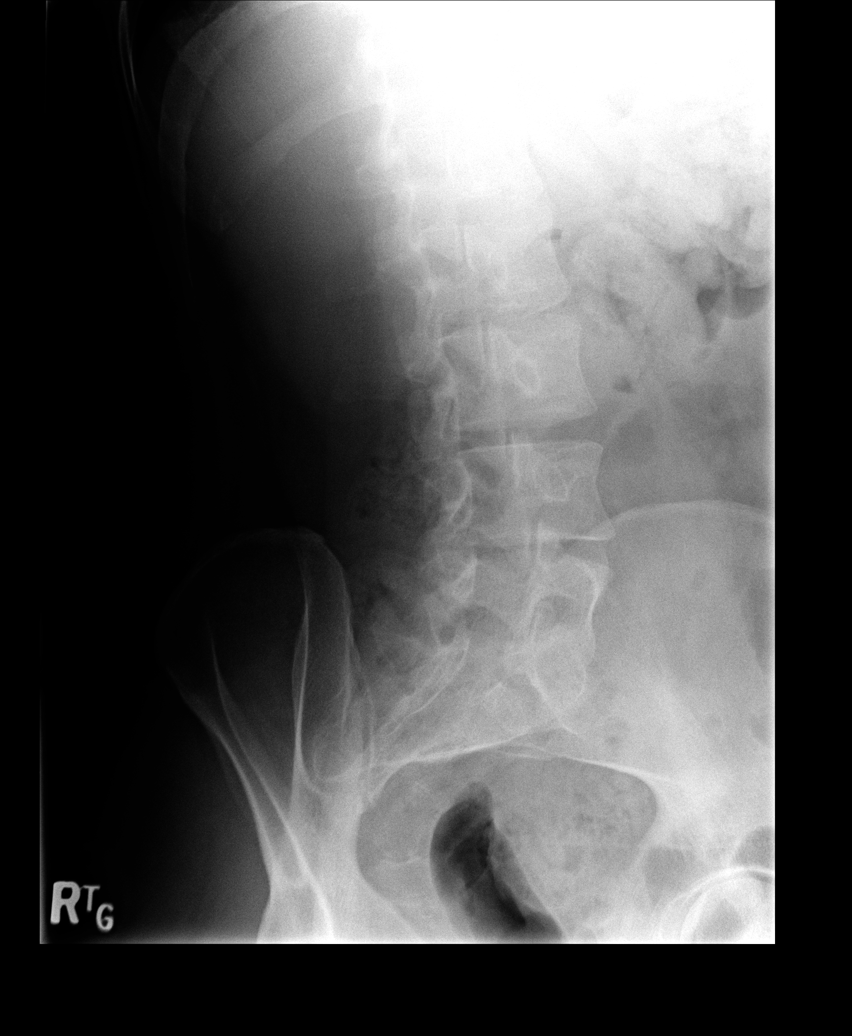

[view not recorded (4 of 5)]
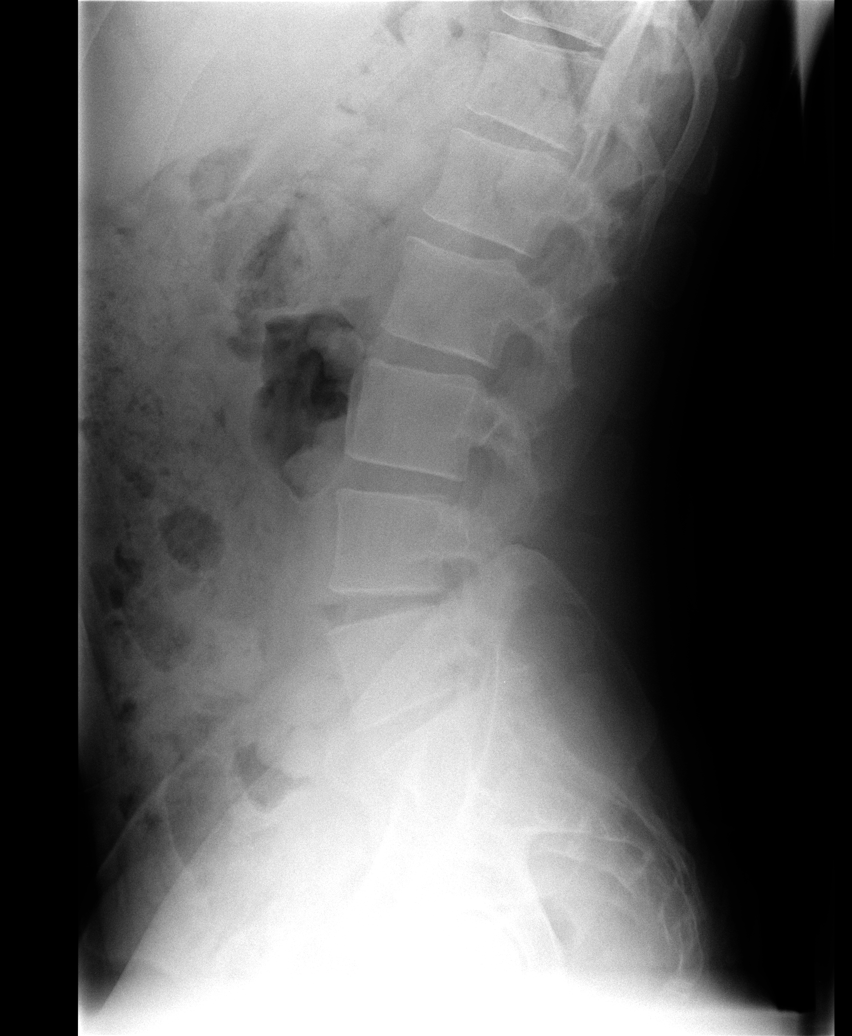

[view not recorded (5 of 5)]
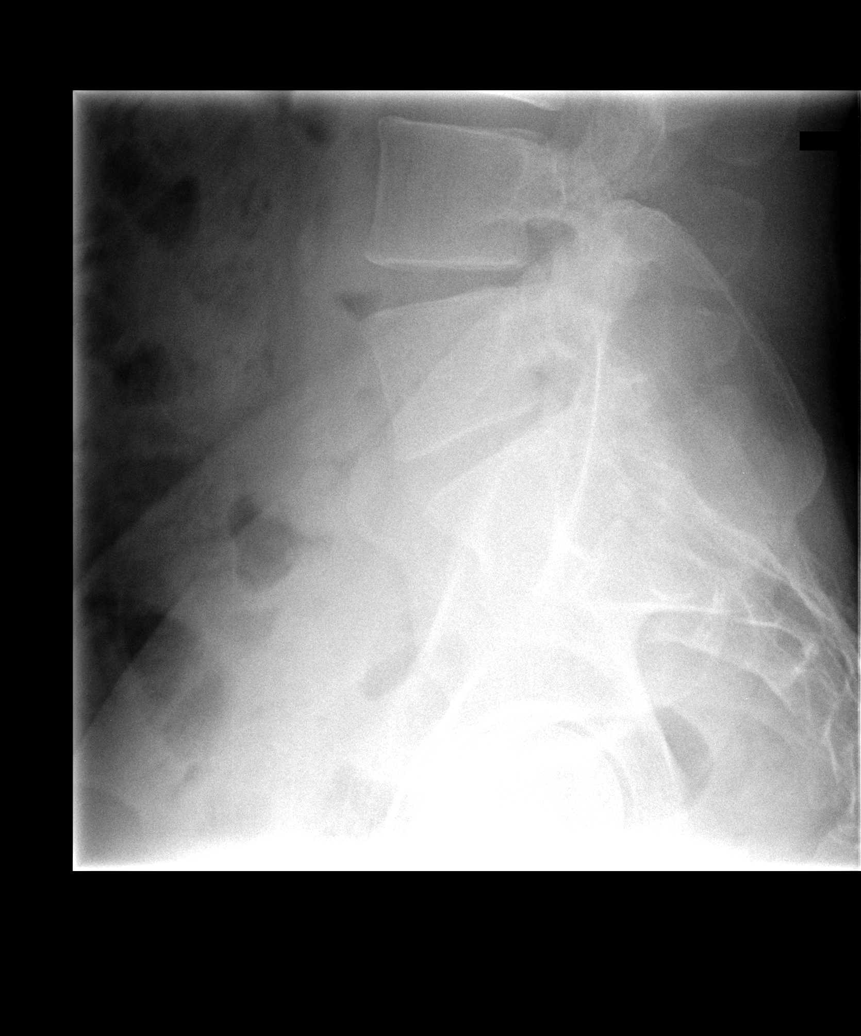

[5 of 5 positions shown; findings below may reference images not displayed]

FINDINGS: Alignment is anatomic. Vertebral body and disc space height are
maintained. No significant degenerative changes. No pars defects.
IMPRESSION: No findings to explain the patient's pain.

## 2016-10-10 ENCOUNTER — Ambulatory Visit: Payer: Self-pay | Admitting: Osteopathic Medicine

## 2017-05-15 ENCOUNTER — Ambulatory Visit: Payer: Self-pay | Admitting: Osteopathic Medicine

## 2017-05-27 ENCOUNTER — Ambulatory Visit: Payer: Self-pay | Admitting: Osteopathic Medicine

## 2017-05-29 ENCOUNTER — Ambulatory Visit: Payer: Self-pay | Admitting: Osteopathic Medicine

## 2017-05-29 DIAGNOSIS — Z0189 Encounter for other specified special examinations: Secondary | ICD-10-CM

## 2017-06-03 ENCOUNTER — Ambulatory Visit (INDEPENDENT_AMBULATORY_CARE_PROVIDER_SITE_OTHER): Payer: BLUE CROSS/BLUE SHIELD | Admitting: Osteopathic Medicine

## 2017-06-03 ENCOUNTER — Ambulatory Visit (INDEPENDENT_AMBULATORY_CARE_PROVIDER_SITE_OTHER): Payer: BLUE CROSS/BLUE SHIELD

## 2017-06-03 ENCOUNTER — Encounter: Payer: Self-pay | Admitting: Osteopathic Medicine

## 2017-06-03 VITALS — BP 136/81 | HR 71 | Ht 69.0 in | Wt 177.0 lb

## 2017-06-03 DIAGNOSIS — Z23 Encounter for immunization: Secondary | ICD-10-CM | POA: Diagnosis not present

## 2017-06-03 DIAGNOSIS — Z8709 Personal history of other diseases of the respiratory system: Secondary | ICD-10-CM

## 2017-06-03 DIAGNOSIS — R05 Cough: Secondary | ICD-10-CM | POA: Diagnosis not present

## 2017-06-03 DIAGNOSIS — Z87898 Personal history of other specified conditions: Secondary | ICD-10-CM

## 2017-06-03 DIAGNOSIS — Z0000 Encounter for general adult medical examination without abnormal findings: Secondary | ICD-10-CM

## 2017-06-03 LAB — CBC
HCT: 43.7 % (ref 38.5–50.0)
HEMOGLOBIN: 15.1 g/dL (ref 13.2–17.1)
MCH: 31.3 pg (ref 27.0–33.0)
MCHC: 34.6 g/dL (ref 32.0–36.0)
MCV: 90.5 fL (ref 80.0–100.0)
MPV: 9.1 fL (ref 7.5–12.5)
Platelets: 366 10*3/uL (ref 140–400)
RBC: 4.83 10*6/uL (ref 4.20–5.80)
RDW: 12.4 % (ref 11.0–15.0)
WBC: 7.1 10*3/uL (ref 3.8–10.8)

## 2017-06-03 LAB — COMPLETE METABOLIC PANEL WITH GFR
AG Ratio: 1.7 (calc) (ref 1.0–2.5)
ALBUMIN MSPROF: 4.5 g/dL (ref 3.6–5.1)
ALT: 37 U/L (ref 9–46)
AST: 21 U/L (ref 10–40)
Alkaline phosphatase (APISO): 82 U/L (ref 40–115)
BILIRUBIN TOTAL: 0.3 mg/dL (ref 0.2–1.2)
BUN: 12 mg/dL (ref 7–25)
CALCIUM: 9.9 mg/dL (ref 8.6–10.3)
CHLORIDE: 103 mmol/L (ref 98–110)
CO2: 29 mmol/L (ref 20–32)
CREATININE: 1.1 mg/dL (ref 0.60–1.35)
GFR, EST AFRICAN AMERICAN: 101 mL/min/{1.73_m2} (ref >=60)
GFR, EST NON AFRICAN AMERICAN: 87 mL/min/{1.73_m2} (ref >=60)
GLUCOSE: 88 mg/dL (ref 65–99)
Globulin: 2.7 g/dL (calc) (ref 1.9–3.7)
Potassium: 4.3 mmol/L (ref 3.5–5.3)
Sodium: 138 mmol/L (ref 135–146)
TOTAL PROTEIN: 7.2 g/dL (ref 6.1–8.1)

## 2017-06-03 LAB — LIPID PANEL
Cholesterol: 195 mg/dL (ref <200)
HDL: 44 mg/dL (ref >40)
LDL CHOLESTEROL (CALC): 115 mg/dL — AB
NON-HDL CHOLESTEROL (CALC): 151 mg/dL — AB (ref <130)
Total CHOL/HDL Ratio: 4.4 (calc) (ref <5.0)
Triglycerides: 252 mg/dL — ABNORMAL HIGH (ref <150)

## 2017-06-03 NOTE — Progress Notes (Signed)
HPI: Gabriel Shelton is a 36 y.o. male  [facility],  for chief complaint of:  Chief Complaint  Patient presents with  . Annual Exam      Patient here for annual physical / wellness exam.  See preventive care reviewed as below.  Recent labs reviewed.   Additional concerns today include:  Ongoing cough for few months, on and off, worse in evening, assoc occasionally w/ vaping and with indigestion. No fever/cills or weight loss.   He will be undergoing surgery with ENT soon for ear issues    Past medical, surgical, social and family history reviewed: Patient Active Problem List   Diagnosis Date Noted  . Hyperlipidemia 09/27/2014  . Distal biceps tendinitis on left 06/29/2014  . Radiculitis of right cervical region 05/16/2014  . GERD (gastroesophageal reflux disease)   . Major depressive disorder, recurrent episode, unspecified 03/17/2014  . Social anxiety disorder 03/17/2014  . Generalized anxiety disorder 03/03/2014  . Cholesteatoma of attic of left ear 03/03/2014  . Lumbar radiculopathy 03/02/2014  . Abdominal pain, unspecified site 03/02/2014  . Hearing loss 03/02/2014  . 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 INNER EAR SURGERY    . LITHOTRIPSY     Social History  Substance Use Topics  . Smoking status: Current Every Day Smoker    Packs/day: 1.00    Years: 12.00  . Smokeless tobacco: Never Used  . Alcohol use No   Family History  Problem Relation Age of Onset  . Bipolar disorder Neg Hx   . Depression Neg Hx      Current medication list and allergy/intolerance information reviewed:   No current outpatient prescriptions on file.   No current facility-administered medications for this visit.    Allergies  Allergen Reactions  . Ambien [Zolpidem Tartrate]     hallucinations      Review of Systems:  Constitutional:  No  fever, no chills,  No recent illness, No unintentional weight changes. No significant fatigue.   HEENT: No  headache, no vision change, no hearing change, No sore throat, No  sinus pressure  Cardiac: No  chest pain, No  pressure, No palpitations, No  Orthopnea  Respiratory:  No  shortness of breath. No  Cough  Gastrointestinal: No  abdominal pain, No  nausea, No  vomiting,  No  blood in stool, No  diarrhea, No  constipation   Musculoskeletal: No new myalgia/arthralgia  Genitourinary: No  incontinence, No  abnormal genital bleeding, No abnormal genital discharge  Skin: No  Rash, No other wounds/concerning lesions  Hem/Onc: No  easy bruising/bleeding, No  abnormal lymph node  Neurologic: No  weakness, No  dizziness  Psychiatric: No  concerns with depression, No  concerns with anxiety, No sleep problems, No mood problems  Exam:  BP 136/81   Pulse 71   Ht 5\' 9"  (1.753 m)   Wt 177 lb (80.3 kg)   BMI 26.14 kg/m   Constitutional: VS see above. General Appearance: alert, well-developed, well-nourished, NAD  Eyes: Normal lids and conjunctive, non-icteric sclera  Ears, Nose, Mouth, Throat: MMM, Normal external inspection ears/nares/mouth/lips/gums. TM (+)scar tissue worse on L   Neck: No masses, trachea midline. No thyroid enlargement. No tenderness/mass appreciated. No lymphadenopathy  Respiratory: Normal respiratory effort. no wheeze, no rhonchi, no rales  Cardiovascular: S1/S2 normal, no murmur, no rub/gallop auscultated. RRR. No lower extremity edema.   Gastrointestinal: Nontender, no masses. No  hepatomegaly, no splenomegaly. No hernia appreciated. Bowel sounds normal. Rectal exam deferred.   Musculoskeletal: Gait normal. No clubbing/cyanosis of digits.   Neurological: Normal balance/coordination. No tremor. No cranial nerve deficit on limited exam. Motor and sensation intact and symmetric. Cerebellar reflexes intact.   Skin: warm, dry, intact. No rash/ulcer. No concerning nevi or subq nodules  on limited exam.    Psychiatric: Normal judgment/insight. Normal mood and affect. Oriented x3.    ASSESSMENT/PLAN:   Annual physical exam - Plan: CBC, COMPLETE METABOLIC PANEL WITH GFR, Lipid panel  Need for immunization against influenza - Plan: Flu Vaccine QUAD 36+ mos IM  History of persistent cough - Plan: DG Chest 2 View   Patient Instructions  Plan:  Cough: Xray today, can try nasal spray (Flonase, Nasonex or generics) +/- antacids for 1-2 months (Zantac 150 - 300 mg daily, Prilosec/Omeprazole 20 mg daily). If cough persists, come see me!    Annual: labs today. As long as labs are good, see me again in 1 year!       MALE PREVENTIVE CARE  updated 06/03/17  ANNUAL SCREENING/COUNSELING  Any changes to health in the past year? no  Diet/Exercise - HEALTHY HABITS DISCUSSED TO DECREASE CV RISK History  Smoking Status  . Current Every Day Smoker  . Packs/day: 1.00  . Years: 12.00  Smokeless Tobacco  . Never Used   History  Alcohol Use No   No flowsheet data found.  SEXUAL/REPRODUCTIVE HEALTH  STI testing needed/desired today? - no  Any concerns with testosterone/libido? - no  INFECTIOUS DISEASE SCREENING  HIV - does not need  GC/CT - does not need  HepC - does not need  TB - does not need  CANCER SCREENING  Lung - USPSTF: 55-80yo w/ 30 py hx unless quit w/in 68yr - does not need  Colon - does not need  Prostate - does not need  OTHER DISEASE SCREENING  Lipid - need  DM2 - needs  AAA - 65-75yo ever smoked: does not need  Osteoporosis - men 35yo+ - does not need  ADULT VACCINATION  Influenza - annual vaccine recommended  Td - booster every 10 years   Zoster - option at 53, yes at 60+   PCV13 - was not indicated  PPSV23 - was not indicated Immunization History  Administered Date(s) Administered  . Influenza,inj,Quad PF,6+ Mos 06/03/2017  . Influenza-Unspecified 07/24/2014, 07/25/2015, 07/27/2016  . Tdap 09/26/2014      Visit  summary with medication list and pertinent instructions was printed for patient to review. All questions at time of visit were answered - patient instructed to contact office with any additional concerns. ER/RTC precautions were reviewed with the patient. Follow-up plan: Return in about 1 year (around 06/03/2018) for [facility], sooner if needed for cough follow-up or other problems as they arise! Marland Kitchen

## 2017-06-03 NOTE — Patient Instructions (Signed)
Plan:  Cough: Xray today, can try nasal spray (Flonase, Nasonex or generics) +/- antacids for 1-2 months (Zantac 150 - 300 mg daily, Prilosec/Omeprazole 20 mg daily). If cough persists, come see me!    Annual: labs today. As long as labs are good, see me again in 1 year!

## 2017-07-02 ENCOUNTER — Encounter: Payer: Self-pay | Admitting: Osteopathic Medicine

## 2017-07-09 ENCOUNTER — Ambulatory Visit: Payer: Self-pay | Admitting: Osteopathic Medicine

## 2017-09-25 ENCOUNTER — Encounter: Payer: Self-pay | Admitting: Osteopathic Medicine

## 2017-10-02 ENCOUNTER — Ambulatory Visit (INDEPENDENT_AMBULATORY_CARE_PROVIDER_SITE_OTHER): Payer: BLUE CROSS/BLUE SHIELD | Admitting: Osteopathic Medicine

## 2017-10-02 DIAGNOSIS — Z0289 Encounter for other administrative examinations: Secondary | ICD-10-CM

## 2017-10-02 NOTE — Progress Notes (Signed)
Appt made erroneously - no charge

## 2018-02-12 ENCOUNTER — Encounter: Payer: Self-pay | Admitting: Osteopathic Medicine

## 2018-06-09 ENCOUNTER — Encounter: Payer: Self-pay | Admitting: Osteopathic Medicine

## 2018-06-09 ENCOUNTER — Ambulatory Visit (INDEPENDENT_AMBULATORY_CARE_PROVIDER_SITE_OTHER): Payer: No Typology Code available for payment source | Admitting: Osteopathic Medicine

## 2018-06-09 VITALS — BP 130/87 | HR 69 | Temp 97.7°F | Wt 178.0 lb

## 2018-06-09 DIAGNOSIS — Z23 Encounter for immunization: Secondary | ICD-10-CM | POA: Diagnosis not present

## 2018-06-09 DIAGNOSIS — Z0000 Encounter for general adult medical examination without abnormal findings: Secondary | ICD-10-CM | POA: Diagnosis not present

## 2018-06-09 DIAGNOSIS — Z113 Encounter for screening for infections with a predominantly sexual mode of transmission: Secondary | ICD-10-CM

## 2018-06-09 NOTE — Progress Notes (Signed)
HPI: Gabriel Shelton is a 37 y.o. adult  who presents to [facility] today, 06/09/18,  for chief complaint of:  Annual physical    Patient here for annual physical / wellness exam.  See preventive care reviewed as below.  No other concerns today       Past medical, surgical, social and family history reviewed: Patient Active Problem List   Diagnosis Date Noted  . Hyperlipidemia 09/27/2014  . Distal biceps tendinitis on left 06/29/2014  . Radiculitis of right cervical region 05/16/2014  . GERD (gastroesophageal reflux disease)   . Major depressive disorder, recurrent episode, unspecified 03/17/2014  . Social anxiety disorder 03/17/2014  . Generalized anxiety disorder 03/03/2014  . Cholesteatoma of attic of left ear 03/03/2014  . Lumbar radiculopathy 03/02/2014  . Abdominal pain, unspecified site 03/02/2014  . Hearing loss 03/02/2014  . 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 INNER EAR SURGERY    . LITHOTRIPSY     Social History   Tobacco Use  . Smoking status: Current Every Day Smoker    Packs/day: 1.00    Years: 12.00    Pack years: 12.00  . Smokeless tobacco: Never Used  Substance Use Topics  . Alcohol use: No   Family History  Problem Relation Age of Onset  . Bipolar disorder Neg Hx   . Depression Neg Hx      Current medication list and allergy/intolerance information reviewed:   No current outpatient medications on file.   No current facility-administered medications for this visit.    Allergies  Allergen Reactions  . Zolpidem Tartrate Other (See Comments)    hallucinations       Review of Systems:  Constitutional:  No  fever, no chills, No recent illness, No unintentional weight changes. No significant fatigue.   HEENT: No  headache, no vision change, no hearing change, No sore throat, No  sinus pressure  Cardiac: No  chest pain, No  pressure, No  palpitations, No  Orthopnea  Respiratory:  No  shortness of breath. No  Cough  Gastrointestinal: No  abdominal pain, No  nausea, No  vomiting,  No  blood in stool, No  diarrhea, No  constipation   Musculoskeletal: No new myalgia/arthralgia  Genitourinary: No  incontinence, No  abnormal genital bleeding, No abnormal genital discharge  Skin: No  Rash, No other wounds/concerning lesions  Hem/Onc: No  easy bruising/bleeding, No  abnormal lymph node  Neurologic: No  weakness, No  dizziness  Psychiatric: No  concerns with depression, No  concerns with anxiety, No sleep problems, No mood problems     Exam:  BP 130/87 (BP Location: Left Arm, Patient Position: Sitting, Cuff Size: Normal)   Pulse 69   Temp 97.7 F (36.5 C) (Oral)   Wt 178 lb (80.7 kg)   BMI 26.29 kg/m    BP Readings from Last 3 Encounters:  06/09/18 130/87  06/03/17 136/81  01/24/16 122/87    Constitutional: VS see above. General Appearance: alert, well-developed, well-nourished, NAD  Eyes: Normal lids and conjunctive, non-icteric sclera  Ears, Nose, Mouth, Throat: MMM, Normal external inspection ears/nares/mouth/lips/gums. TM (+)scar tissue worse on L   Neck: No masses, trachea midline. No thyroid enlargement. No tenderness/mass appreciated. No lymphadenopathy  Respiratory: Normal respiratory effort. no wheeze, no rhonchi, no rales  Cardiovascular: S1/S2 normal, no murmur, no rub/gallop auscultated. RRR. No lower extremity edema.   Gastrointestinal: Nontender, no masses. No hepatomegaly, no splenomegaly. No  hernia appreciated. Bowel sounds normal. Rectal exam deferred.   Musculoskeletal: Gait normal. No clubbing/cyanosis of digits.   Neurological: Normal balance/coordination. No tremor. No cranial nerve deficit on limited exam. Motor and sensation intact and symmetric. Cerebellar reflexes intact.   Skin: warm, dry, intact. No rash/ulcer. No concerning nevi or subq nodules on limited exam.     Psychiatric: Normal judgment/insight. Normal mood and affect. Oriented x3.    ASSESSMENT/PLAN: The primary encounter diagnosis was Annual physical exam. Diagnoses of Need for influenza vaccination and Routine screening for STI (sexually transmitted infection) were also pertinent to this visit.      Patient Instructions  General Preventive Care  Most recent routine screening lipids/other labs: ordered today  Tobacco: don't! Alcohol: responsible moderation is ok for most people. Recreational/Illicit Drugs: don't!  Exercise: as tolerated to reduce risk of cardiovascular disease and diabetes  Mental health: if need for mental health care (medicines, counseling, other), or concerns about moods, please let me know!   Sexual health: if need for STD testing, or concerns about pain/libido, please let me know!   Vaccines  Flu vaccine: recommended every fall (by Halloween!) - done today  Shingles vaccine: recommended after age 20  Pneumonia vaccine: recommended after age 2  Tetanus booster: Tdap recommended every 10 years - yours was done 09/26/2014  Cancer screenings   Colon cancer screening: recommended at age 8, colonoscopy sooner if risk factors   Prostate cancer screening: recommendations vary, optional PSA blood test for men around age 39   Infection screenings . HIV: recommended screening at least once age 27-65, or as needed . Gonorrhea/Chlamydia: screening as needed . Hepatitis C: recommended for anyone born 05-1963 . TB: certain populations: nursing home, incarceration, travel abroad, or healthcare workers   Other . Bone Density Test: recommended for men at age 52, sooner depending on risk factors . Advanced Directive: Living Will and/or Healthcare Power of Attorney recommended for all adults, regardless of age or health . Cholesterol & Diabetes: recommended screening annually . Thyroid and Vitamin D: routine screening not medically necessary, most insurance will  not cover this test       MALE PREVENTIVE CARE  updated 06/09/18  ANNUAL SCREENING/COUNSELING  Any changes to health in the past year? no  Diet/Exercise - HEALTHY HABITS DISCUSSED TO DECREASE CV RISK Social History   Tobacco Use  Smoking Status Current Every Day Smoker  . Packs/day: 1.00  . Years: 12.00  . Pack years: 12.00  Smokeless Tobacco Never Used   Social History   Substance and Sexual Activity  Alcohol Use No   Depression screen PHQ 2/9 06/09/2018  Decreased Interest 0  Down, Depressed, Hopeless 0  PHQ - 2 Score 0  Altered sleeping 1  Tired, decreased energy 0  Change in appetite 0  Feeling bad or failure about yourself  0  Trouble concentrating 0  Moving slowly or fidgety/restless 0  Suicidal thoughts 0  PHQ-9 Score 1  Difficult doing work/chores Not difficult at all    SEXUAL/REPRODUCTIVE HEALTH  STI testing needed/desired today? - no  Any concerns with testosterone/libido? - no  INFECTIOUS DISEASE SCREENING  HIV - needs  GC/CT - needs  HepC - does not need  TB - does not need  CANCER SCREENING  Lung - USPSTF: 55-80yo w/ 30 py hx unless quit w/in 41yr - does not need  Colon - does not need  Prostate - does not need  OTHER DISEASE SCREENING  Lipid - need  DM2 - needs  AAA - 65-75yo ever smoked: does not need  Osteoporosis - men 36yo+ - does not need  ADULT VACCINATION  Influenza - annual vaccine recommended  Td - booster every 10 years   Zoster - option at 58, yes at 60+   PCV13 - was not indicated  PPSV23 - was not indicated Immunization History  Administered Date(s) Administered  . Influenza,inj,Quad PF,6+ Mos 06/03/2017, 06/09/2018  . Influenza-Unspecified 07/24/2014, 07/25/2015, 07/27/2016, 09/13/2016  . Tdap 09/26/2014      Visit summary with medication list and pertinent instructions was printed for patient to review. All questions at time of visit were answered - patient instructed to contact office with  any additional concerns. ER/RTC precautions were reviewed with the patient. Follow-up plan: Return in about 1 year (around 06/10/2019) for ANNUAL PHYSICAL - sooner if needed.

## 2018-06-09 NOTE — Patient Instructions (Signed)
General Preventive Care  Most recent routine screening lipids/other labs: ordered today  Tobacco: don't! Alcohol: responsible moderation is ok for most people. Recreational/Illicit Drugs: don't!  Exercise: as tolerated to reduce risk of cardiovascular disease and diabetes  Mental health: if need for mental health care (medicines, counseling, other), or concerns about moods, please let me know!   Sexual health: if need for STD testing, or concerns about pain/libido, please let me know!   Vaccines  Flu vaccine: recommended every fall (by Halloween!) - done today  Shingles vaccine: recommended after age 36  Pneumonia vaccine: recommended after age 36  Tetanus booster: Tdap recommended every 10 years - yours was done 09/26/2014  Cancer screenings   Colon cancer screening: recommended at age 36, colonoscopy sooner if risk factors   Prostate cancer screening: recommendations vary, optional PSA blood test for men around age 36   Infection screenings . HIV: recommended screening at least once age 36-65, or as needed . Gonorrhea/Chlamydia: screening as needed . Hepatitis C: recommended for anyone born 781945-1964 . TB: certain populations: nursing home, incarceration, travel abroad, or healthcare workers   Other . Bone Density Test: recommended for men at age 36, sooner depending on risk factors . Advanced Directive: Living Will and/or Healthcare Power of Attorney recommended for all adults, regardless of age or health . Cholesterol & Diabetes: recommended screening annually . Thyroid and Vitamin D: routine screening not medically necessary, most insurance will not cover this test

## 2018-06-10 LAB — LIPID PANEL
CHOLESTEROL: 189 mg/dL (ref <200)
HDL: 53 mg/dL (ref >40)
LDL Cholesterol (Calc): 111 mg/dL (calc) — ABNORMAL HIGH
NON-HDL CHOLESTEROL (CALC): 136 mg/dL — AB (ref <130)
TRIGLYCERIDES: 137 mg/dL (ref <150)
Total CHOL/HDL Ratio: 3.6 (calc) (ref <5.0)

## 2018-06-10 LAB — CBC
HEMATOCRIT: 45.7 % (ref 38.5–50.0)
Hemoglobin: 15.6 g/dL (ref 13.2–17.1)
MCH: 31.2 pg (ref 27.0–33.0)
MCHC: 34.1 g/dL (ref 32.0–36.0)
MCV: 91.4 fL (ref 80.0–100.0)
MPV: 9.1 fL (ref 7.5–12.5)
PLATELETS: 378 10*3/uL (ref 140–400)
RBC: 5 10*6/uL (ref 4.20–5.80)
RDW: 12.8 % (ref 11.0–15.0)
WBC: 5.6 10*3/uL (ref 3.8–10.8)

## 2018-06-10 LAB — COMPLETE METABOLIC PANEL WITH GFR
AG RATIO: 2 (calc) (ref 1.0–2.5)
ALKALINE PHOSPHATASE (APISO): 70 U/L (ref 40–115)
ALT: 31 U/L (ref 9–46)
AST: 18 U/L (ref 10–40)
Albumin: 4.8 g/dL (ref 3.6–5.1)
BILIRUBIN TOTAL: 0.4 mg/dL (ref 0.2–1.2)
BUN: 16 mg/dL (ref 7–25)
CO2: 26 mmol/L (ref 20–32)
Calcium: 10 mg/dL (ref 8.6–10.3)
Chloride: 104 mmol/L (ref 98–110)
Creat: 0.89 mg/dL (ref 0.60–1.35)
GFR, Est African American: 128 mL/min/{1.73_m2} (ref >=60)
GFR, Est Non African American: 111 mL/min/{1.73_m2} (ref >=60)
GLOBULIN: 2.4 g/dL (ref 1.9–3.7)
Glucose, Bld: 84 mg/dL (ref 65–139)
POTASSIUM: 4 mmol/L (ref 3.5–5.3)
SODIUM: 139 mmol/L (ref 135–146)
Total Protein: 7.2 g/dL (ref 6.1–8.1)

## 2018-06-10 LAB — C. TRACHOMATIS/N. GONORRHOEAE RNA
C. TRACHOMATIS RNA, TMA: NOT DETECTED
N. GONORRHOEAE RNA, TMA: NOT DETECTED

## 2018-06-10 LAB — HIV ANTIBODY (ROUTINE TESTING W REFLEX): HIV: NONREACTIVE

## 2018-06-10 LAB — HEPATITIS B SURFACE ANTIGEN: Hepatitis B Surface Ag: NONREACTIVE

## 2018-06-10 LAB — HEPATITIS B CORE ANTIBODY, TOTAL: HEP B C TOTAL AB: NONREACTIVE

## 2018-06-10 LAB — RPR: RPR Ser Ql: NONREACTIVE

## 2018-10-06 ENCOUNTER — Encounter: Payer: Self-pay | Admitting: Osteopathic Medicine

## 2018-10-06 ENCOUNTER — Ambulatory Visit (INDEPENDENT_AMBULATORY_CARE_PROVIDER_SITE_OTHER): Payer: No Typology Code available for payment source | Admitting: Osteopathic Medicine

## 2018-10-06 DIAGNOSIS — F411 Generalized anxiety disorder: Secondary | ICD-10-CM | POA: Diagnosis not present

## 2018-10-06 MED ORDER — ALPRAZOLAM 0.25 MG PO TABS: 0.2500 mg | ORAL_TABLET | Freq: Every day | ORAL | 0 refills | Status: DC | PRN

## 2018-10-06 MED ORDER — ESCITALOPRAM OXALATE 10 MG PO TABS: 10.0000 mg | ORAL_TABLET | Freq: Every day | ORAL | 2 refills | Status: DC

## 2018-10-06 NOTE — Patient Instructions (Signed)
We are starting a medication today called Lexapro to help treat your anxiety/panic. This is a daily medication to help control your symptoms.   I also highly encourage my patients who are suffering from anxiety to seek care with a counselor or therapist. A therapist can coach you in techniques to recognize and deal with troubling thought patterns and behaviors. The ability to cope with external stressors is crucial to overall mental health. We will have a therapist starting in this office soon, I will let you know when that is.   Expect a call or message form this office in the next 2 weeks to check in: If you're doing well on the medicine but not feeling any effect, we can increase the dose. If you're starting to feel some effect/improvement, we can hold off on a dose increase and reevaluate at your office visit.   Try the as-needed Xanax medicine once per day if needed for severe anxiety or panic.  Can take Benadryl and/or Melatonin OTC in addition to the above medicines, as needed for sleep.   Let's plan to follow up in the office in 4 weeks. At that time, we can talk about how well the medicine is working for you, and we can consider increasing the dose, adding another medicine, etc.   If we are having trouble finding a good medication regimen for you, we can consult with a psychiatrist to assist with medication management.   If you experience problematic side effects, please let me know ASAP - we can switch the medicine any time, and we don't need an appointment for this.   For immediate mental health services:  [facility], [address] [place], [place] [address], [phone]  [facility], [address] [place], [place] [address], [phone]  Any emergency room or 911  National Suicide Prevention Lifeline at 351-325-2646 (put this number in your cell phone so you have it in your contacts if needed!)   Any questions or  concerns, call me!

## 2018-10-06 NOTE — Progress Notes (Signed)
HPI: Gabriel Shelton is a 38 y.o. adult who  has a past medical history of Anxiety, Depression, and GERD (gastroesophageal reflux disease).  he presents to [facility] today, 10/06/18,  for chief complaint of:  Anxiety/panic  . Context: history of anxiety ad questionable previous diagnosis of mood disorder (he thinks a therapist told him this at some point but MDQ negative otherwise as below). Reports stressful relationship situation over the past month or so.  . Quality: increased stress, notes heart races when he feels anxious.  . Duration: this episode seems to be lasting about a month.  . Timing: initial onset 5 years ago.  . Modifying factors: Previous treatment w/ Zoloft (caused suicidal ideation), Lexapro (was on this for awhile without issue) and Xanax (reports never used this every day, aware of potential for dependence)      Depression screen [facility] 2/9 10/06/2018 06/09/2018  Decreased Interest 0 0  Down, Depressed, Hopeless 2 0  PHQ - 2 Score 2 0  Altered sleeping 3 1  Tired, decreased energy 0 0  Change in appetite 0 0  Feeling bad or failure about yourself  1 0  Trouble concentrating 0 0  Moving slowly or fidgety/restless 0 0  Suicidal thoughts 0 0  PHQ-9 Score 6 1  Difficult doing work/chores Not difficult at all Not difficult at all   GAD 7 : Generalized Anxiety Score 10/06/2018 06/09/2018  Nervous, Anxious, on Edge 3 0  Control/stop worrying 3 0  Worry too much - different things 0 0  Trouble relaxing 3 0  Restless 0 0  Easily annoyed or irritable 1 0  Afraid - awful might happen 0 0  Total GAD 7 Score 10 0  Anxiety Difficulty Somewhat difficult Not difficult at all   MDQ negative though he report she has been told in the past that he has been diagnosed w/ manic-depressive illness or bipolar.      At today's visit... Past medical history, surgical history, and family history reviewed and updated as needed.  Current medication  list and allergy/intolerance information reviewed and updated as needed. (See remainder of HPI, ROS, Phys Exam below)          ASSESSMENT/PLAN: The encounter diagnosis was Generalized anxiety disorder.  Meds ordered this encounter  Medications  . escitalopram (LEXAPRO) 10 MG tablet    Sig: Take 1 tablet (10 mg total) by mouth daily.    Dispense:  30 tablet    Refill:  2  . ALPRAZolam (XANAX) 0.25 MG tablet    Sig: Take 1 tablet (0.25 mg total) by mouth daily as needed for anxiety.    Dispense:  30 tablet    Refill:  0    Patient Instructions  We are starting a medication today called Lexapro to help treat your anxiety/panic. This is a daily medication to help control your symptoms.   I also highly encourage my patients who are suffering from anxiety to seek care with a counselor or therapist. A therapist can coach you in techniques to recognize and deal with troubling thought patterns and behaviors. The ability to cope with external stressors is crucial to overall mental health. We will have a therapist starting in this office soon, I will let you know when that is.   Expect a call or message form this office in the next 2 weeks to check in: If you're doing well on the medicine but not feeling any effect, we can increase the dose. If  you're starting to feel some effect/improvement, we can hold off on a dose increase and reevaluate at your office visit.   Try the as-needed Xanax medicine once per day if needed for severe anxiety or panic.  Can take Benadryl and/or Melatonin OTC in addition to the above medicines, as needed for sleep.   Let's plan to follow up in the office in 4 weeks. At that time, we can talk about how well the medicine is working for you, and we can consider increasing the dose, adding another medicine, etc.   If we are having trouble finding a good medication regimen for you, we can consult with a psychiatrist to assist with medication management.   If you  experience problematic side effects, please let me know ASAP - we can switch the medicine any time, and we don't need an appointment for this.   For immediate mental health services:  [facility], [address], [place], [place] [address], [phone]  [facility], [address] [place], [place] [address], [phone]  Any emergency room or 911  National Suicide Prevention Lifeline at (364)857-4305 (put this number in your cell phone so you have it in your contacts if needed!)   Any questions or concerns, call me!       Follow-up plan: Return in about 4 weeks (around 11/03/2018) for recheck anxiety - sooner if needed .                             ############################################ ############################################ ############################################ ############################################    No outpatient medications have been marked as taking for the 10/06/18 encounter (Office Visit) with Sunnie Nielsen, DO.    Allergies  Allergen Reactions  . Zolpidem Tartrate Other (See Comments)    hallucinations        Review of Systems:  Constitutional: No recent illness  HEENT: No  headache, no vision change  Cardiac: No  chest pain, No  pressure, +palpitations  Respiratory:  No  shortness of breath. No  Cough  Gastrointestinal: No  abdominal pain, no change on bowel habits  Musculoskeletal: No new myalgia/arthralgia  Skin: No  Rash  Hem/Onc: No  easy bruising/bleeding, No  abnormal lumps/bumps  Neurologic: No  weakness, No  Dizziness  Psychiatric: No  concerns with depression, +concerns with anxiety  Exam:  BP 133/85 (BP Location: Left Arm, Patient Position: Sitting, Cuff Size: Normal)   Pulse 82   Temp 97.9 F (36.6 C) (Oral)   Wt 164 lb 8 oz (74.6 kg)   BMI 24.29 kg/m   Constitutional: VS see above. General Appearance: alert,  well-developed, well-nourished, NAD  Eyes: Normal lids and conjunctive, non-icteric sclera  Ears, Nose, Mouth, Throat: MMM, Normal external inspection ears/nares/mouth/lips/gums.  Neck: No masses, trachea midline.   Respiratory: Normal respiratory effort. no wheeze, no rhonchi, no rales  Cardiovascular: S1/S2 normal, no murmur, no rub/gallop auscultated. RRR.   Musculoskeletal: Gait normal. Symmetric and independent movement of all extremities  Neurological: Normal balance/coordination. No tremor.  Skin: warm, dry, intact.   Psychiatric: Normal judgment/insight. Normal mood and affect. Oriented x3.       Visit summary with medication list and pertinent instructions was printed for patient to review, patient was advised to alert [place] if any updates are needed. All questions at time of visit were answered - patient instructed to contact office with any additional concerns. ER/RTC precautions were reviewed with the patient and understanding verbalized.  Note: Total time spent 25 minutes, greater than 50% of the visit was spent face-to-face counseling and coordinating care for the following: The encounter diagnosis was Generalized anxiety disorder..  Please note: voice recognition software was used to produce this document, and typos may escape review. Please contact Dr. Lyn HollingsheadAlexander for any needed clarifications.    Follow up plan: Return in about 4 weeks (around 11/03/2018) for recheck anxiety - sooner if needed .

## 2018-10-14 ENCOUNTER — Encounter: Payer: Self-pay | Admitting: Osteopathic Medicine

## 2018-10-14 DIAGNOSIS — F411 Generalized anxiety disorder: Secondary | ICD-10-CM

## 2018-10-14 NOTE — Telephone Encounter (Signed)
Arline Asp, I think I ordered this correctly? I meant to follow up on this earlier in the week but got sidetracked - can we ask them to call him by end of the week? Thanks!

## 2018-10-16 ENCOUNTER — Other Ambulatory Visit: Payer: Self-pay | Admitting: Osteopathic Medicine

## 2018-10-16 DIAGNOSIS — F411 Generalized anxiety disorder: Secondary | ICD-10-CM

## 2018-10-21 NOTE — Telephone Encounter (Signed)
Called Pt, he reports taking this Rx as needed. Some days he would take 2-3 per day. Some days he wouldn't take any. States now he is "feeling better" now that the lexapro is "starting to kick in."

## 2018-10-21 NOTE — Telephone Encounter (Signed)
Early for refill - can we call him and see how he's taking this? We had discussed daily as needed (it's on the Rx and the AVS) but sounds like he might be taking more than prescribed.Gabriel KitchenMarland Shelton

## 2018-10-21 NOTE — Telephone Encounter (Signed)
Noted He should not have taken more than prescribed Will address further refills at his follow-up

## 2018-10-22 ENCOUNTER — Encounter: Payer: Self-pay | Admitting: Osteopathic Medicine

## 2018-10-22 NOTE — Telephone Encounter (Signed)
See other message

## 2018-10-23 MED ORDER — ALPRAZOLAM 0.25 MG PO TABS: 0.2500 mg | ORAL_TABLET | Freq: Every day | ORAL | 0 refills | Status: DC | PRN

## 2018-10-26 NOTE — Telephone Encounter (Signed)
He says he's going to come in Monday AM? Today? Not sure why.   He can keep his follow-up on 11/03/18 unless he needs something urgently - I'm not clear with his most recent message can we call him and ask what he means? Thanks.

## 2018-10-26 NOTE — Telephone Encounter (Signed)
Pt has been updated & has confirmed to keep his upcoming appt on 11/03/17. No other inquiries during call.

## 2018-11-03 ENCOUNTER — Ambulatory Visit: Payer: Self-pay | Admitting: Osteopathic Medicine

## 2018-11-10 ENCOUNTER — Ambulatory Visit: Payer: Self-pay | Admitting: Osteopathic Medicine

## 2018-11-10 ENCOUNTER — Ambulatory Visit: Payer: Self-pay | Admitting: Psychology

## 2018-11-24 ENCOUNTER — Ambulatory Visit: Payer: Self-pay | Admitting: Psychology

## 2019-03-11 ENCOUNTER — Encounter: Payer: Self-pay | Admitting: Osteopathic Medicine

## 2019-03-11 ENCOUNTER — Telehealth: Payer: Self-pay | Admitting: Osteopathic Medicine

## 2019-03-11 ENCOUNTER — Ambulatory Visit (INDEPENDENT_AMBULATORY_CARE_PROVIDER_SITE_OTHER): Payer: Self-pay | Admitting: Osteopathic Medicine

## 2019-03-11 DIAGNOSIS — F411 Generalized anxiety disorder: Secondary | ICD-10-CM

## 2019-03-11 DIAGNOSIS — F410 Panic disorder [episodic paroxysmal anxiety] without agoraphobia: Secondary | ICD-10-CM

## 2019-03-11 MED ORDER — ALPRAZOLAM 0.5 MG PO TABS: 0.2500 mg | ORAL_TABLET | Freq: Two times a day (BID) | ORAL | 0 refills | Status: DC | PRN

## 2019-03-11 NOTE — Progress Notes (Signed)
Virtual Visit via Phone Note  I connected with      Gabriel Shelton on 03/11/19 at 4:45 by a telemedicine application and verified that I am speaking with the correct person using two identifiers.  Patient is at home I am in office    I discussed the limitations of evaluation and management by telemedicine and the availability of in person appointments. The patient expressed understanding and agreed to proceed.  History of Present Illness: Gabriel Shelton is a 38 y.o. adult who would like to discuss panic attack     11:30 / 12:00 today, was driving, feeling lightheaded and chest was pounding. 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 Stated his pulse was racing, BP 195/?, feeling flushed but no fever. EMS was called, BP rechecked at 140/?,  Episode gradually got better over the course of an hour.  History of anxiety, he has been reluctant to be on medications, at one point we did have him on Lexapro with bridging Xanax.  He would like to avoid medications at this point but he is concerned about the potential for another panic attack.  He reports he has been working 7 days/week for the past 3 months without a single day off.  Reports stress recently also due to death of father, financial issues due to some medical problems with his girlfriend.  He thinks it has all been piling up on him.      Observations/Objective: There were no vitals taken for this visit. BP Readings from Last 3 Encounters:  10/06/18 133/85  06/09/18 130/87  06/03/17 136/81   Exam: Normal Speech.    Lab and Radiology Results No results found for this or any previous visit (from the past 72 hour(s)). No results found.     Assessment and Plan: 38 y.o. adult with The primary encounter diagnosis was Panic attack. A diagnosis of Generalized anxiety disorder was also pertinent to this visit.   Without office visit/exam/labs, patient is advised that it is difficult to rule out any other cause for his  episode but it certainly does sound more like a panic attack and that seems likely given the exceptional stress he has been under and hours that he has been working.  He states that his job is cutting back his hours and will work on getting more rest.  I sent a prescription for alprazolam to have on hand as needed in case of another panic attack though he is advised not to take this routinely especially if he will be driving.   PDMP not reviewed this encounter. No orders of the defined types were placed in this encounter.  Meds ordered this encounter  Medications  . ALPRAZolam (XANAX) 0.5 MG tablet    Sig: Take 0.5-1 tablets (0.25-0.5 mg total) by mouth 2 (two) times daily as needed for anxiety.    Dispense:  15 tablet    Refill:  0     Follow Up Instructions: Return in about 4 weeks (around 04/08/2019) for Virtual visit to recheck anxiety levels, sooner if needed..    I discussed the assessment and treatment plan with the patient. The patient was provided an opportunity to ask questions and all were answered. The patient agreed with the plan and demonstrated an understanding of the instructions.   The patient was advised to call back or seek an in-person evaluation if any new concerns, if symptoms worsen or if the condition fails to improve as anticipated.  25 minutes of  non-face-to-face time was provided during this encounter.                      Historical information moved to improve visibility of documentation.  Past Medical History:  Diagnosis Date  . Anxiety   . Depression   . GERD (gastroesophageal reflux disease)    Past Surgical History:  Procedure Laterality Date  . INNER EAR SURGERY    . LITHOTRIPSY     Social History   Tobacco Use  . Smoking status: Current Every Day Smoker    Packs/day: 1.00    Years: 12.00    Pack years: 12.00  . Smokeless tobacco: Never Used  Substance Use Topics  . Alcohol use: No   family history is not on  file.  Medications: Current Outpatient Medications  Medication Sig Dispense Refill  . ALPRAZolam (XANAX) 0.5 MG tablet Take 0.5-1 tablets (0.25-0.5 mg total) by mouth 2 (two) times daily as needed for anxiety. 15 tablet 0   No current facility-administered medications for this visit.    Allergies  Allergen Reactions  . Zolpidem Tartrate Other (See Comments)    hallucinations     PDMP not reviewed this encounter. No orders of the defined types were placed in this encounter.  Meds ordered this encounter  Medications  . ALPRAZolam (XANAX) 0.5 MG tablet    Sig: Take 0.5-1 tablets (0.25-0.5 mg total) by mouth 2 (two) times daily as needed for anxiety.    Dispense:  15 tablet    Refill:  0

## 2019-03-11 NOTE — Telephone Encounter (Signed)
See progress note.

## 2019-03-11 NOTE — Telephone Encounter (Signed)
Patient came into the clinic asked to speak to Gabriel Shelton about a panic attack he had a few hours ago. 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 I asked if he would like to be put on the schedule right away, he said no. I then asked the patient to speak to our triage nurse Orlie Dakin) who made sure he was stable enough to go home since he did not want to be seen right away.

## 2019-03-12 ENCOUNTER — Encounter: Payer: Self-pay | Admitting: Osteopathic Medicine

## 2019-03-24 ENCOUNTER — Encounter: Payer: Self-pay | Admitting: Osteopathic Medicine

## 2019-03-24 DIAGNOSIS — F411 Generalized anxiety disorder: Secondary | ICD-10-CM

## 2019-03-24 MED ORDER — ALPRAZOLAM 0.5 MG PO TABS: 0.2500 mg | ORAL_TABLET | Freq: Two times a day (BID) | ORAL | 0 refills | Status: AC | PRN

## 2019-03-24 NOTE — Telephone Encounter (Signed)
[place] prescription drug monitoring program was reviewed for Alprazolam: 10/06/18 filled #30 0.25 mg 11/03/18 filled #15 0.25 mg Last filled #15 0.5 mg on 03/11/2019 about 2 weeks ago.   I'm concerned for risk of dependence, and/or frequent use of the medicine to treat panic attacks that might be better managed with preventive medications. I'm ok to fill now, but you really need to make #30 tablets last 90 days, or we can set up virtual visit to discuss other treatment options!

## 2019-04-16 ENCOUNTER — Ambulatory Visit (INDEPENDENT_AMBULATORY_CARE_PROVIDER_SITE_OTHER): Payer: Self-pay | Admitting: Osteopathic Medicine

## 2019-04-16 ENCOUNTER — Encounter: Payer: Self-pay | Admitting: Osteopathic Medicine

## 2019-04-16 VITALS — Wt 170.0 lb

## 2019-04-16 DIAGNOSIS — Z0000 Encounter for general adult medical examination without abnormal findings: Secondary | ICD-10-CM

## 2019-04-16 DIAGNOSIS — F411 Generalized anxiety disorder: Secondary | ICD-10-CM

## 2019-04-16 NOTE — Progress Notes (Signed)
Virtual Visit via Phone   I connected with      Gabriel Shelton on 04/16/19 at 8:10 Am by a telemedicine application and verified that I am speaking with the correct person using two identifiers.  Patient is at home I am in office   I discussed the limitations of evaluation and management by telemedicine and the availability of in person appointments. The patient expressed understanding and agreed to proceed.  History of Present Illness: Gabriel Shelton is a 38 y.o. adult who would like to discuss recheck anxiety    Pt feeling better, not having as much panic or anxiety, feels like "things are winding down a little" and stress is better/ He's taken some time off work. Not really using the alprazolam.   [facility] 04/16/2019 03/11/2019 10/06/2018  Decreased Interest 0 2 0  Down, Depressed, Hopeless 1 1 2   PHQ - 2 Score 1 3 2   Altered sleeping 0 3 3  Tired, decreased energy 0 3 0  Change in appetite 0 0 0  Feeling bad or failure about yourself  0 1 1  Trouble concentrating 1 0 0  Moving slowly or fidgety/restless 0 0 0  Suicidal thoughts 0 0 0  PHQ-9 Score 2 10 6   Difficult doing work/chores - - Not difficult at all   GAD 7 : Generalized Anxiety Score 04/16/2019 03/11/2019 10/06/2018 06/09/2018  Nervous, Anxious, on Edge 1 0 3 0  Control/stop worrying 1 3 3  0  Worry too much - different things 0 3 0 0  Trouble relaxing 1 1 3  0  Restless 0 0 0 0  Easily annoyed or irritable 1 0 1 0  Afraid - awful might happen 0 0 0 0  Total GAD 7 Score 4 7 10  0  Anxiety Difficulty Not difficult at all Not difficult at all Somewhat difficult Not difficult at all           Observations/Objective: Wt 170 lb (77.1 kg)   BMI 25.10 kg/m  BP Readings from Last 3 Encounters:  10/06/18 133/85  06/09/18 130/87  06/03/17 136/81   Exam: Normal Speech.    Lab and Radiology Results No results found for this or any previous visit (from the past 72 hour(s)). No results  found.     Assessment and Plan: 38 y.o. adult with The primary encounter diagnosis was Generalized anxiety disorder. A diagnosis of Annual physical exam was also pertinent to this visit.  Labs ordered for future visit. Annual physical / preventive care was NOT performed or billed today.   DOing well. Pt would like to just kee pan eye on things for now, no med changes. I think that's fine! Plan for annual in the fall, offered labs ahead of time and can see me in person or we can do annual virtually.   PDMP not reviewed this encounter. Orders Placed This Encounter  Procedures  . CBC  . COMPLETE METABOLIC PANEL WITH GFR  . Lipid panel  . TSH   No orders of the defined types were placed in this encounter.  There are no Patient Instructions on file for this visit.  Instructions sent via MyChart. If MyChart not available, pt was given option for info via personal e-mail w/ no guarantee of protected health info over unsecured e-mail communication, and MyChart sign-up instructions were included.   Follow Up Instructions: Return in about 3 months (around 07/17/2019) for ANNUAL CAN BE IN PERSON OR VIRTUAL (labs prior to  visit, orders are in).    I discussed the assessment and treatment plan with the patient. The patient was provided an opportunity to ask questions and all were answered. The patient agreed with the plan and demonstrated an understanding of the instructions.   The patient was advised to call back or seek an in-person evaluation if any new concerns, if symptoms worsen or if the condition fails to improve as anticipated.  10 minutes of non-face-to-face time was provided during this encounter.                      Historical information moved to improve visibility of documentation.  Past Medical History:  Diagnosis Date  . Anxiety   . Depression   . GERD (gastroesophageal reflux disease)    Past Surgical History:  Procedure Laterality Date  . INNER  EAR SURGERY    . LITHOTRIPSY     Social History   Tobacco Use  . Smoking status: Current Every Day Smoker    Packs/day: 1.00    Years: 12.00    Pack years: 12.00  . Smokeless tobacco: Never Used  Substance Use Topics  . Alcohol use: No   family history is not on file.  Medications: Current Outpatient Medications  Medication Sig Dispense Refill  . ALPRAZolam (XANAX) 0.5 MG tablet Take 0.5-1 tablets (0.25-0.5 mg total) by mouth 2 (two) times daily as needed for anxiety. Sparing use to prevent dependence 30 tablet 0   No current facility-administered medications for this visit.    Allergies  Allergen Reactions  . Zolpidem Tartrate Other (See Comments)    hallucinations     PDMP not reviewed this encounter. Orders Placed This Encounter  Procedures  . CBC  . COMPLETE METABOLIC PANEL WITH GFR  . Lipid panel  . TSH   No orders of the defined types were placed in this encounter.

## 2019-07-02 LAB — NOVEL CORONAVIRUS, NAA: SARS-CoV-2, NAA: NOT DETECTED

## 2019-07-05 ENCOUNTER — Encounter: Payer: Self-pay | Admitting: Osteopathic Medicine

## 2019-07-20 ENCOUNTER — Encounter: Payer: Self-pay | Admitting: Osteopathic Medicine

## 2019-07-21 ENCOUNTER — Encounter: Payer: Self-pay | Admitting: Osteopathic Medicine

## 2019-07-22 ENCOUNTER — Telehealth: Payer: Self-pay | Admitting: Osteopathic Medicine

## 2019-07-22 NOTE — Telephone Encounter (Signed)
Hospital follow up. Virtual Visit ok if allowed Dizziness and chest pain but they said it was probably a pulled muscle Dr. Loni Muse does not have any openings should I schedule with another Provider?

## 2019-07-22 NOTE — Telephone Encounter (Signed)
I have this patient scheduled for a MyChart appt with Dr. Sheppard Coil and he was not kept in the ER overnight, it was just a ER vsit

## 2019-07-23 ENCOUNTER — Encounter: Payer: Self-pay | Admitting: Osteopathic Medicine

## 2019-07-23 ENCOUNTER — Telehealth: Payer: Self-pay

## 2019-07-23 ENCOUNTER — Telehealth (INDEPENDENT_AMBULATORY_CARE_PROVIDER_SITE_OTHER): Payer: Self-pay | Admitting: Osteopathic Medicine

## 2019-07-23 ENCOUNTER — Other Ambulatory Visit: Payer: Self-pay | Admitting: Osteopathic Medicine

## 2019-07-23 VITALS — Wt 170.0 lb

## 2019-07-23 DIAGNOSIS — R0789 Other chest pain: Secondary | ICD-10-CM

## 2019-07-23 DIAGNOSIS — F419 Anxiety disorder, unspecified: Secondary | ICD-10-CM

## 2019-07-23 MED ORDER — NAPROXEN 500 MG PO TABS: 500.0000 mg | ORAL_TABLET | Freq: Two times a day (BID) | ORAL | 1 refills | Status: DC

## 2019-07-23 MED ORDER — PREDNISONE 20 MG PO TABS: 20.0000 mg | ORAL_TABLET | Freq: Two times a day (BID) | ORAL | 0 refills | Status: DC

## 2019-07-23 NOTE — Progress Notes (Signed)
See phone/mychart messages  Tanna Furry, can ignore phone note!

## 2019-07-23 NOTE — Telephone Encounter (Signed)
Pt called stating that the pain in his chest is now worse. He pick up a box and the pain in his chest has increased. Requesting feed back from provider.

## 2019-07-23 NOTE — Telephone Encounter (Signed)
Pt had virtual visit with provider today. No other inquiries.

## 2019-07-23 NOTE — Telephone Encounter (Signed)
If worse w/ movements, likely strain of chest wall muscle - muscle between ribs or possibly pectoral muscle. Can try Ibuprofen up to 800 mg 3-4 times per day, I can send some steroid to pharmacy as well which might help. I've ordered stress test just to rule out heart problems though as discussed at visit I have a very low suspicion of this.

## 2019-07-23 NOTE — Telephone Encounter (Signed)
PCP has spoke to patient. No appointment is needed.

## 2019-07-23 NOTE — Progress Notes (Signed)
Virtual Visit via Video (App used: Doximity) Note  I connected with      Gabriel Shelton on 07/23/19 at 10:18 AM by a telemedicine application and verified that I am speaking with the correct person using two identifiers.  Patient is in his work truck (stopped at side of road) I am in office    I discussed the limitations of evaluation and management by telemedicine and the availability of in person appointments. The patient expressed understanding and agreed to proceed.  History of Present Illness: Gabriel Shelton is a 38 y.o. male who would like to discuss ER follow-up     Seen in ER 2 days ago, 07/21/2019, for chest pain that morning with dizziness.  ER notes reviewed, patient reported he woke up around 1 AM with some chest pain left of the sternum.  Thought he might of just pulled a muscle since he works for [employer] and does a lot of lifting for his job.  Reported associated dizziness/anxiety, he states the dizzy spells are typical for him when he is feeling anxious.  Pain was worse with movement and breathing, NO shortness of breath, neck pain, jaw pain, arm pain, back pain, diaphoresis, nausea, vomiting.  In ER, chest x-ray was normal, EKG unable to reviewed tracing but reports showed sinus rhythm with sinus arrhythmia otherwise normal EKG.  Sent home with hydroxyzine to take at bedtime, ibuprofen 800 mg 3 times daily as needed.  Troponin level was normal but does not look like it was trended.  Calcium slightly above normal but otherwise metabolic panel ok.  WBC slightly elevated at 12.0.  Tested for flu a and flu B which were both negative, does not look like any Covid testing was done.   Patient reports no additional episodes, no history of prior episodes of this pain.  It seems to have resolved.  He denies any chest pain or shortness of breath on exertion.  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       Observations/Objective: Wt 170 lb (77.1 kg)   BMI 25.10 kg/m  BP Readings from Last 3 Encounters:  10/06/18 133/85  06/09/18 130/87  06/03/17 136/81   Exam: Normal Speech.  NAD  Lab and Radiology Results No results found for this or any previous visit (from the past 72 hour(s)). No results found.     Assessment and Plan: 38 y.o. male with The primary encounter diagnosis was Non-cardiac chest pain. A diagnosis of Anxiety was also pertinent to this visit.  Would have like to see trend of troponins but overall history and ER report are not consistent with cardiac chest pain.  I advised we could certainly proceed with treadmill stress test at least but patient declines this for now.  He is advised to let me know if chest pain recurs or any other concerning symptoms, and ER precautions were reviewed.  We discussed antianxiety medications as a possibility, patient does not want to be on any controlled substance anxiolytics, we discussed daily SSRI therapy for maintenance/prevention and he will think about this.  Will think about counseling.  Will let me know if he wants any of these medications or services      Follow Up Instructions: Return for Annual  physical July 2021 when due.  See me sooner if needed!.    I discussed the assessment and treatment plan with the patient. The patient was provided an opportunity to ask questions and all were answered. The patient agreed with the plan and demonstrated an understanding of the instructions.   The patient was advised to call back or seek an in-person evaluation if any new concerns, if symptoms worsen or if the condition fails to improve as anticipated.  25 minutes of non-face-to-face time was provided during this encounter.                      Historical information moved  to improve visibility of documentation.  Past Medical History:  Diagnosis Date  . Anxiety   . Depression   . GERD (gastroesophageal reflux disease)    Past Surgical History:  Procedure Laterality Date  . INNER EAR SURGERY    . LITHOTRIPSY     Social History   Tobacco Use  . Smoking status: Current Every Day Smoker    Packs/day: 1.00    Years: 12.00    Pack years: 12.00  . Smokeless tobacco: Never Used  Substance Use Topics  . Alcohol use: No   family history is not on file.  Medications: Current Outpatient Medications  Medication Sig Dispense Refill  . hydrOXYzine (VISTARIL) 25 MG capsule Take by mouth.    Marland Kitchen ibuprofen (ADVIL) 800 MG tablet Take by mouth.     No current facility-administered medications for this visit.    Allergies  Allergen Reactions  . Zolpidem Tartrate Other (See Comments)    hallucinations     PDMP not reviewed this encounter. No orders of the defined types were placed in this encounter.  No orders of the defined types were placed in this encounter.

## 2019-07-29 LAB — NOVEL CORONAVIRUS, NAA
SARS-CoV-2, NAA: NOT DETECTED
SARS-CoV-2, NAA: NOT DETECTED

## 2019-07-30 ENCOUNTER — Encounter: Payer: Self-pay | Admitting: Osteopathic Medicine

## 2019-07-30 ENCOUNTER — Encounter (HOSPITAL_COMMUNITY): Payer: Self-pay | Admitting: Osteopathic Medicine

## 2019-07-30 LAB — HM HIV SCREENING LAB: HM HIV Screening: NEGATIVE

## 2019-07-30 LAB — HM HEPATITIS C SCREENING LAB: HM Hepatitis Screen: NEGATIVE

## 2019-07-30 NOTE — Telephone Encounter (Signed)
Gabriel Shelton, is there a way for you to abstract this?  Let me know if you have issues viewing the scanned document.

## 2019-08-05 ENCOUNTER — Encounter: Payer: Self-pay | Admitting: Osteopathic Medicine

## 2019-08-05 ENCOUNTER — Telehealth (HOSPITAL_COMMUNITY): Payer: Self-pay

## 2019-08-05 ENCOUNTER — Telehealth: Payer: Self-pay | Admitting: Osteopathic Medicine

## 2019-08-05 LAB — LAB REPORT - SCANNED
Chlamydia Tr: NEGATIVE
RPR: NONREACTIVE
Trichomonas: NONREACTIVE

## 2019-08-05 NOTE — Telephone Encounter (Signed)
New message    Just an FYI. We have made several attempts to contact this patient including sending a letter to schedule or reschedule their echocardiogram. We will be removing the patient from the echo WQ.   11.12.20 @ 2:56pm  both  # are the same  - lm on home vm - Wynell Halberg 11.6.20 mail reminder letter Orlan Aversa  11.3.20 @ 4:00pm lm on home vm  - Walden Statz

## 2019-08-05 NOTE — Telephone Encounter (Signed)
Patient called, stating that during his last visit with you he discussed anxiety and being put on medication, he was wondering if it is possible if he could be put on them and wanted a call back regarding this. Please Advise.

## 2019-08-06 MED ORDER — ESCITALOPRAM OXALATE 10 MG PO TABS: 10.0000 mg | ORAL_TABLET | Freq: Every day | ORAL | 0 refills | Status: DC

## 2019-08-06 NOTE — Telephone Encounter (Signed)
Virtual Visit has been scheduled for 09/09/2019 and patient was made aware if he has any problems with the medication to give [place] a call back sooner.

## 2019-08-06 NOTE — Telephone Encounter (Signed)
Scription for antianxiety medication was sent to pharmacy on file.  Patient needs to schedule a follow-up with me 4 weeks after starting the medicine, let us know sooner than that if there are any problems.  Please schedule him for virtual visit follow-up anxiety

## 2019-08-10 IMAGING — DX DG CHEST 2V
2 series · 2 of 2 positions shown · non-contrast
Comparison: None.

CLINICAL DATA: Persistent cough for several weeks, smoking history

EXAM:
CHEST  2 VIEW

[chest pa]
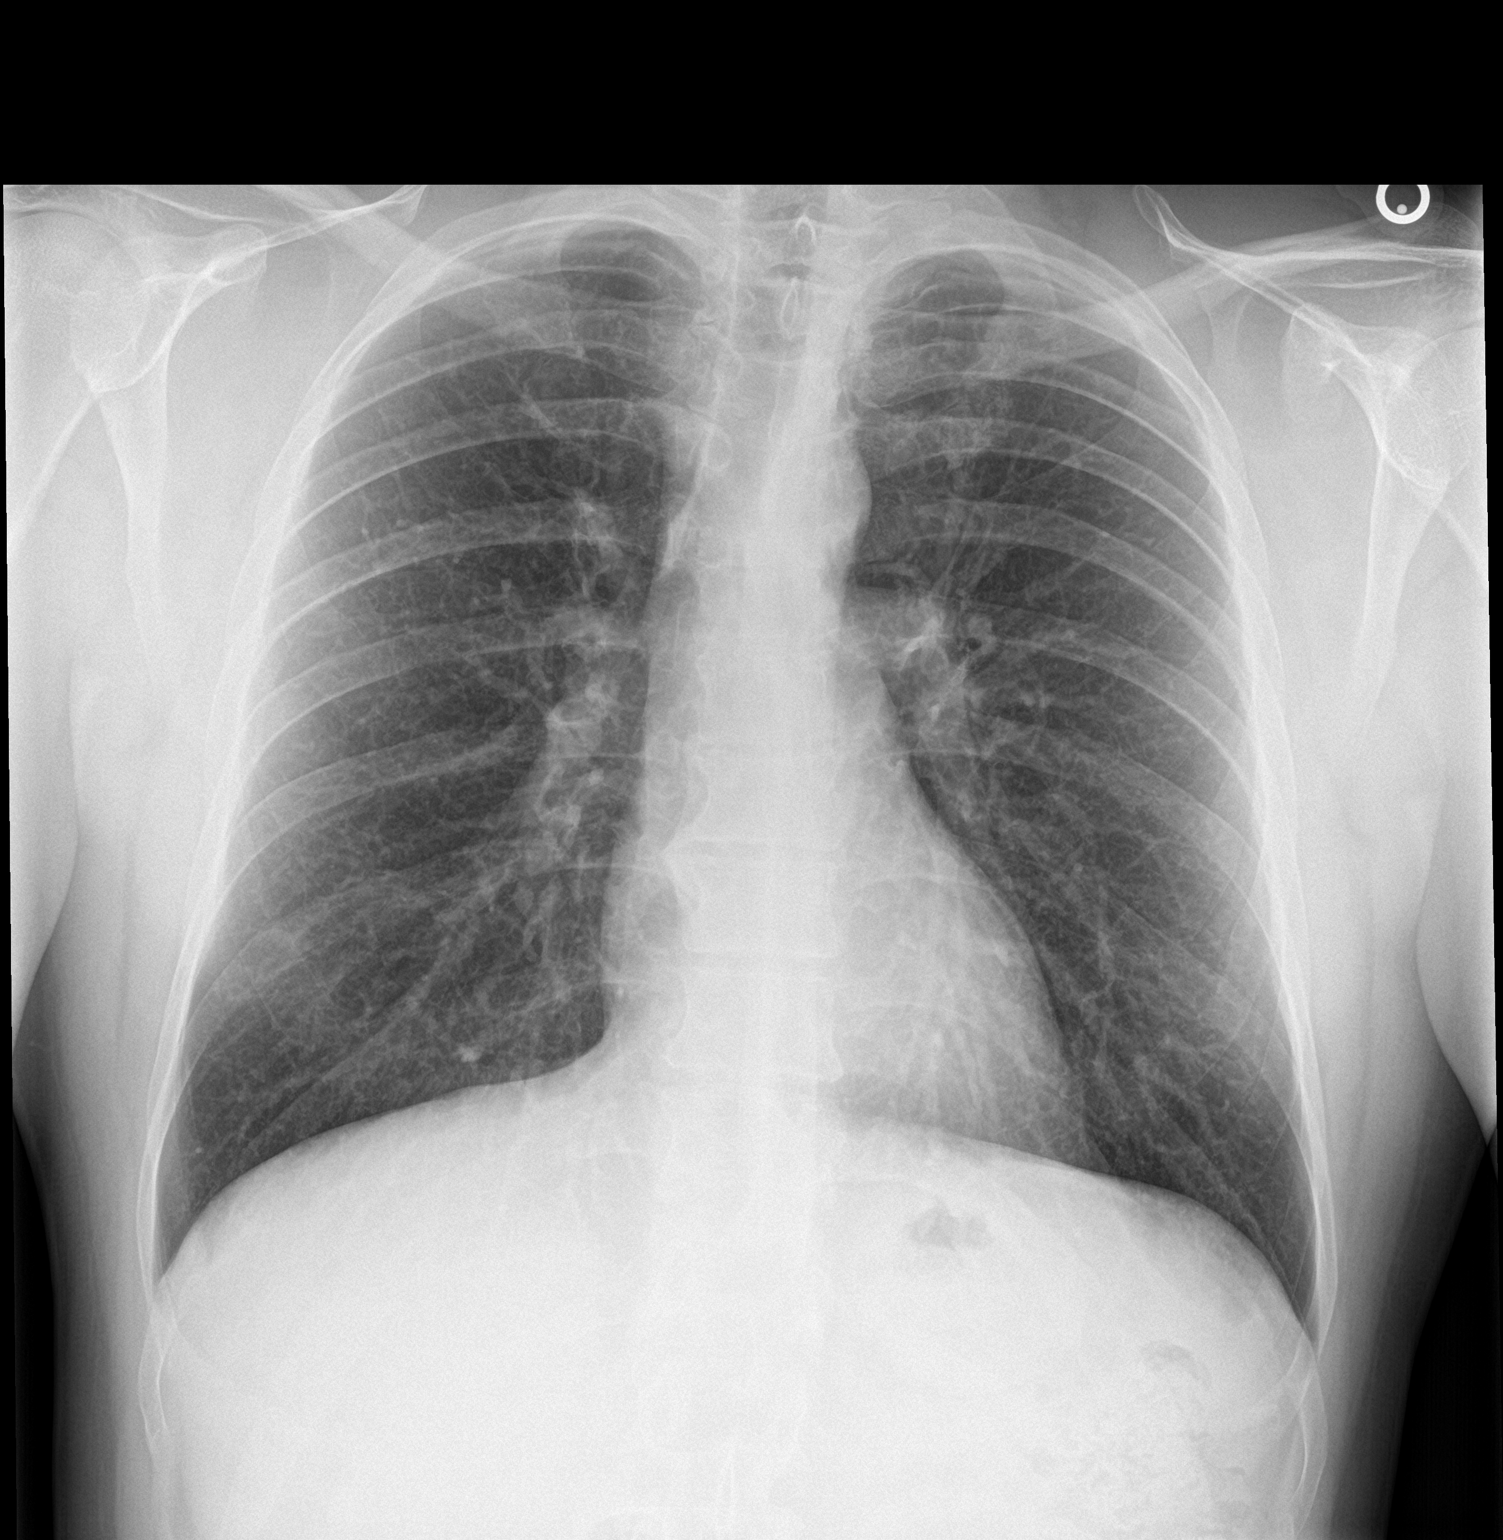

[chest lat]
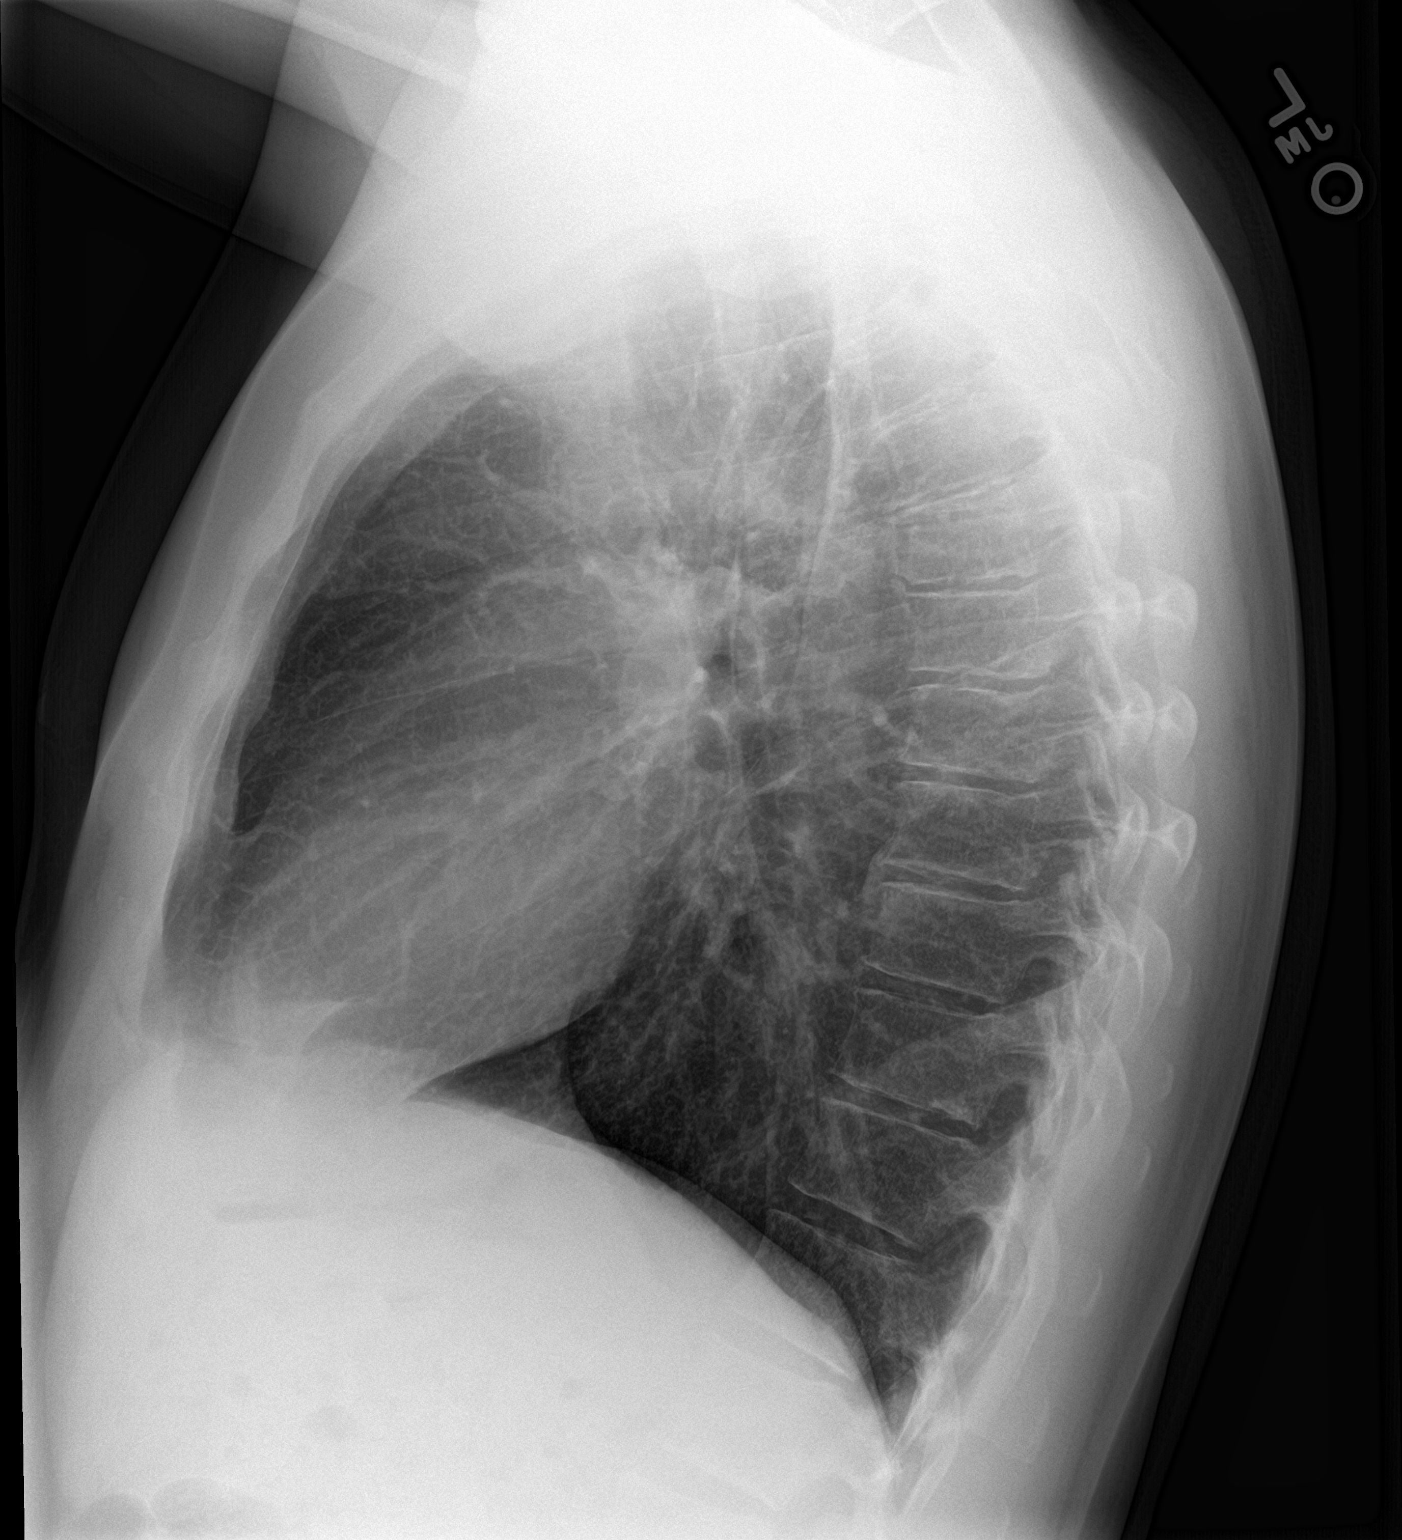

[2 of 2 positions shown; findings below may reference images not displayed]

FINDINGS: No active infiltrate or effusion is seen. The lungs are slightly
hyperaerated. There is mild peribronchial thickening which may
indicate bronchitis. Mediastinal and hilar contours are
unremarkable. The heart is within normal limits in size. No bony
abnormality is seen.
IMPRESSION: 1. No pneumonia or effusion.
2. Question bronchitis.
3. Slight hyper aeration.

## 2019-09-01 ENCOUNTER — Encounter: Payer: Self-pay | Admitting: Osteopathic Medicine

## 2019-09-02 ENCOUNTER — Ambulatory Visit (INDEPENDENT_AMBULATORY_CARE_PROVIDER_SITE_OTHER): Payer: No Typology Code available for payment source | Admitting: Osteopathic Medicine

## 2019-09-02 ENCOUNTER — Encounter: Payer: Self-pay | Admitting: Osteopathic Medicine

## 2019-09-02 VITALS — Wt 170.0 lb

## 2019-09-02 DIAGNOSIS — F411 Generalized anxiety disorder: Secondary | ICD-10-CM | POA: Diagnosis not present

## 2019-09-02 MED ORDER — DULOXETINE HCL 20 MG PO CPEP: 20.0000 mg | ORAL_CAPSULE | Freq: Every day | ORAL | 1 refills | Status: DC

## 2019-09-02 NOTE — Telephone Encounter (Signed)
Left message for patient to return call.

## 2019-09-02 NOTE — Patient Instructions (Addendum)
Plan:  Okay to stop the Lexapro. I have sent in a different medication, duloxetine AKA Cymbalta, can start this medication after you are off the Lexapro for a few days.  Let's see how this does for you over the next month or so, but if you experience any problems or concerning side effects, please let me know right away.  For immediate mental health services if needed:  [facility], [address] [place], [place] [address], [facility], [address] [place], [place] [address], [phone]  Any emergency room  National Suicide Prevention [facility]eline, [phone]   Return in about 5 weeks (around 10/07/2019) for Virtual visit, recheck on new medication for anxiety.

## 2019-09-02 NOTE — Telephone Encounter (Signed)
Patient has been scheduled for 09/02/2019. He did not want to wait until next week to talk about changes in his Lexapro.

## 2019-09-02 NOTE — Progress Notes (Signed)
Virtual Visit via Phone  I connected with      Gabriel Shelton on 09/02/19 at 10:15 AM  by a telemedicine application and verified that I am speaking with the correct person using two identifiers.  Patient is at work  I am in office   I discussed the limitations of evaluation and management by telemedicine and the availability of in person appointments. The patient expressed understanding and agreed to proceed.  History of Present Illness: Gabriel Shelton is a 38 y.o. male who would like to discuss anxiety, medication problem    Patient had been seen a few times over the past couple of months for episodes of chest pain, association with anxiety.  He had been reluctant to be on medications, he sent [place] a phone/MyChart message 08/05/2019 asking to start medications.  Since we had previously discussed starting on SSRI, I was okay to go ahead and set Lexapro.  We received a message from the patient 09/01/2019, yesterday, about a month after medications were sent, that he was having some concerns for side effects.  Patient was scheduled today for further discussion.  Reports he's saying and doing things he normally wouldn't do. Described as he feels irritable and short tempered since being on the Lexapro, although it seems to be helping his anxiety.  Zoloft caused suicidality in the past Sleep is a problem, melatonin is not helping    Depression screen [facility] 2/9 09/02/2019 04/16/2019 03/11/2019  Decreased Interest 0 0 2  Down, Depressed, Hopeless 1 1 1   PHQ - 2 Score 1 1 3   Altered sleeping 3 0 3  Tired, decreased energy 0 0 3  Change in appetite 0 0 0  Feeling bad or failure about yourself  0 0 1  Trouble concentrating 0 1 0  Moving slowly or fidgety/restless 0 0 0  Suicidal thoughts 0 0 0  PHQ-9 Score 4 2 10   Difficult doing work/chores - - -   GAD 7 : Generalized Anxiety Score 09/02/2019 04/16/2019 03/11/2019 10/06/2018  Nervous, Anxious, on Edge 0 1 0 3  Control/stop worrying 1 1 3 3    Worry too much - different things 0 0 3 0  Trouble relaxing 1 1 1 3   Restless 0 0 0 0  Easily annoyed or irritable 3 1 0 1  Afraid - awful might happen 0 0 0 0  Total GAD 7 Score 5 4 7 10   Anxiety Difficulty Not difficult at all Not difficult at all Not difficult at all Somewhat difficult       Observations/Objective: Wt 170 lb (77.1 kg)   BMI 25.10 kg/m  BP Readings from Last 3 Encounters:  10/06/18 133/85  06/09/18 130/87  06/03/17 136/81   Exam: Normal Speech.  NAD  Lab and Radiology Results No results found for this or any previous visit (from the past 72 hour(s)). No results found.     Assessment and Plan: 38 y.o. male with The encounter diagnosis was Generalized anxiety disorder.   PDMP not reviewed this encounter. No orders of the defined types were placed in this encounter.  Meds ordered this encounter  Medications  . DULoxetine (CYMBALTA) 20 MG capsule    Sig: Take 1 capsule (20 mg total) by mouth daily.    Dispense:  30 capsule    Refill:  1   Patient Instructions  Plan:  Okay to stop the Lexapro. I have sent in a different medication, duloxetine AKA Cymbalta, can start this medication after  you are off the Lexapro for a few days.  Let's see how this does for you over the next month or so, but if you experience any problems or concerning side effects, please let me know right away.  For immediate mental health services if needed:  [facility], [address], [place], [place] [address], [phone]  [facility], [address], [place], [place] [address], [phone]  Any emergency room  National Suicide Prevention [facility]eline, 708-688-6221   Return in about 5 weeks (around 10/07/2019) for Virtual visit, recheck on new medication for anxiety.    Instructions sent via MyChart. If MyChart not available, pt was given option for info via personal e-mail w/ no guarantee of protected health info  over unsecured e-mail communication, and MyChart sign-up instructions were sent to patient.   Follow Up Instructions: Return in about 5 weeks (around 10/07/2019) for Virtual visit, recheck on new medication for anxiety.    I discussed the assessment and treatment plan with the patient. The patient was provided an opportunity to ask questions and all were answered. The patient agreed with the plan and demonstrated an understanding of the instructions.   The patient was advised to call back or seek an in-person evaluation if any new concerns, if symptoms worsen or if the condition fails to improve as anticipated.  21 minutes of non-face-to-face time was provided during this encounter.      . . . . . . . . . . . . . Marland Kitchen                   Historical information moved to improve visibility of documentation.  Past Medical History:  Diagnosis Date  . Anxiety   . Depression   . GERD (gastroesophageal reflux disease)    Past Surgical History:  Procedure Laterality Date  . INNER EAR SURGERY    . LITHOTRIPSY     Social History   Tobacco Use  . Smoking status: Current Every Day Smoker    Packs/day: 1.00    Years: 12.00    Pack years: 12.00  . Smokeless tobacco: Never Used  Substance Use Topics  . Alcohol use: No   family history is not on file.  Medications: Current Outpatient Medications  Medication Sig Dispense Refill  . naproxen (NAPROSYN) 500 MG tablet Take 1 tablet (500 mg total) by mouth 2 (two) times daily with a meal. Take every day for one week, then use as needed after that 60 tablet 1  . escitalopram (LEXAPRO) 10 MG tablet Take 1 tablet (10 mg total) by mouth daily. Start at 5 mg/half tablet daily for first 1 to 2 weeks then increase to 10 mg/whole tablet daily (Patient not taking: Reported on 09/02/2019) 90 tablet 0   No current facility-administered medications for this visit.   Allergies  Allergen Reactions  . Zolpidem Tartrate Other  (See Comments)    hallucinations

## 2019-09-09 ENCOUNTER — Ambulatory Visit: Payer: Self-pay | Admitting: Osteopathic Medicine

## 2020-01-11 ENCOUNTER — Encounter: Payer: Self-pay | Admitting: Osteopathic Medicine

## 2020-01-12 ENCOUNTER — Encounter: Payer: Self-pay | Admitting: Osteopathic Medicine

## 2020-01-17 ENCOUNTER — Encounter: Payer: Self-pay | Admitting: Osteopathic Medicine

## 2020-01-21 DIAGNOSIS — F339 Major depressive disorder, recurrent, unspecified: Secondary | ICD-10-CM | POA: Insufficient documentation

## 2020-01-22 DIAGNOSIS — F1010 Alcohol abuse, uncomplicated: Secondary | ICD-10-CM | POA: Insufficient documentation

## 2020-01-24 ENCOUNTER — Encounter: Payer: Self-pay | Admitting: Osteopathic Medicine

## 2020-01-31 ENCOUNTER — Encounter: Payer: Self-pay | Admitting: Osteopathic Medicine

## 2020-02-01 ENCOUNTER — Other Ambulatory Visit: Payer: Self-pay

## 2020-02-01 ENCOUNTER — Encounter: Payer: Self-pay | Admitting: Emergency Medicine

## 2020-02-01 ENCOUNTER — Emergency Department (INDEPENDENT_AMBULATORY_CARE_PROVIDER_SITE_OTHER)
Admission: EM | Admit: 2020-02-01 | Discharge: 2020-02-01 | Disposition: A | Discharge status: Home / Self Care | Payer: Self-pay | Source: Home / Self Care

## 2020-02-01 DIAGNOSIS — M546 Pain in thoracic spine: Secondary | ICD-10-CM

## 2020-02-01 HISTORY — DX: Calculus of kidney: N20.0

## 2020-02-01 MED ORDER — METHOCARBAMOL 500 MG PO TABS: 500.0000 mg | ORAL_TABLET | Freq: Two times a day (BID) | ORAL | 0 refills | Status: DC | PRN

## 2020-02-01 NOTE — Telephone Encounter (Signed)
Needs appt - this looks positive for syphilis.... but no confirmatory levels, no indication of whether he was treated.   No advice without appt, I need to know the story here since I didn't order this test. Need records from whatever clinic did. They are responsible for answering his questions.

## 2020-02-01 NOTE — Discharge Instructions (Signed)
  You may take 500mg  acetaminophen every 4-6 hours or in combination with ibuprofen 400-600mg  every 6-8 hours as needed for pain and inflammation.  Robaxin (methocarbamol) is a muscle relaxer and may cause drowsiness. Do not drink alcohol, drive, or operate heavy machinery while taking.  Call to schedule a follow up appointment with Sports Medicine or Family medicine in 1-2 weeks if not improving.
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
## 2020-02-08 ENCOUNTER — Encounter: Payer: Self-pay | Admitting: Osteopathic Medicine

## 2020-02-10 ENCOUNTER — Telehealth (INDEPENDENT_AMBULATORY_CARE_PROVIDER_SITE_OTHER): Payer: Self-pay | Admitting: Osteopathic Medicine

## 2020-02-10 ENCOUNTER — Encounter: Payer: Self-pay | Admitting: Osteopathic Medicine

## 2020-02-10 VITALS — Wt 172.0 lb

## 2020-02-10 DIAGNOSIS — Z113 Encounter for screening for infections with a predominantly sexual mode of transmission: Secondary | ICD-10-CM

## 2020-02-10 DIAGNOSIS — Z8619 Personal history of other infectious and parasitic diseases: Secondary | ICD-10-CM

## 2020-02-10 MED ORDER — BUPROPION HCL ER (XL) 150 MG PO TB24: 150.0000 mg | ORAL_TABLET | Freq: Every day | ORAL | 3 refills | Status: DC

## 2020-02-10 NOTE — Progress Notes (Signed)
Virtual Visit via Video (App used: MyChart) Note  I connected with      Gabriel Shelton on 02/10/20 at 8:34 AM  by a telemedicine application and verified that I am speaking with the correct person using two identifiers.  At 8:34 pt was showing as connected but audio/video not working I tried calling and left voicemail at 8:35 - would try again I na few mintues but if unable to conenect may need to reschedule  Tried connecting vi MyChart again at 8:36 - patient not signed on and still not signed on as of 8:39 Called him back 8:39  Patient is at work  I am in office   I discussed the limitations of evaluation and management by telemedicine and the availability of in person appointments. The patient expressed understanding and agreed to proceed.  History of Present Illness: Gabriel Shelton is a 39 y.o. male who would like to discuss refill Wellbutrin, (+)Syphilis test on at-home STD kit -has been contacted by health depts, has not been treated, I cannot see confirmatory tests/titers, see scanned docs.       Observations/Objective: Wt 172 lb (78 kg)   BMI 25.40 kg/m  BP Readings from Last 3 Encounters:  02/01/20 126/87  10/06/18 133/85  06/09/18 130/87   Exam: Normal Speech.  NAD  Lab and Radiology Results No results found for this or any previous visit (from the past 72 hour(s)). No results found.     Assessment and Plan: 39 y.o. male with The primary encounter diagnosis was Hx of positive serological reaction for syphilis. A diagnosis of Routine screening for STI (sexually transmitted infection) was also pertinent to this visit.   PDMP not reviewed this encounter. Orders Placed This Encounter  Procedures  . C. trachomatis/N. gonorrhoeae RNA  . Trichomonas vaginalis, RNA  . Hepatitis C antibody, reflex  . HIV Antibody (routine testing w rflx)  . RPR   Meds ordered this encounter  Medications  . buPROPion (WELLBUTRIN XL) 150 MG 24 hr tablet    Sig: Take 1  tablet (150 mg total) by mouth daily.    Dispense:  90 tablet    Refill:  3     Follow Up Instructions: Return for RECHECK PENDING RESULTS / IF WORSE OR CHANGE.    I discussed the assessment and treatment plan with the patient. The patient was provided an opportunity to ask questions and all were answered. The patient agreed with the plan and demonstrated an understanding of the instructions.   The patient was advised to call back or seek an in-person evaluation if any new concerns, if symptoms worsen or if the condition fails to improve as anticipated.  30 minutes of non-face-to-face time was provided during this encounter.      . . . . . . . . . . . . . Marland Kitchen                   Historical information moved to improve visibility of documentation.  Past Medical History:  Diagnosis Date  . Anxiety   . Depression   . GERD (gastroesophageal reflux disease)   . Kidney stone    Past Surgical History:  Procedure Laterality Date  . INNER EAR SURGERY    . LITHOTRIPSY     Social History   Tobacco Use  . Smoking status: Current Every Day Smoker    Packs/day: 1.00    Years: 12.00    Pack years: 12.00  . Smokeless  tobacco: Never Used  Substance Use Topics  . Alcohol use: No   family history includes Healthy in his mother; Heart failure in his father.  Medications: Current Outpatient Medications  Medication Sig Dispense Refill  . buPROPion (WELLBUTRIN XL) 150 MG 24 hr tablet Take 1 tablet (150 mg total) by mouth daily. 90 tablet 3  . methocarbamol (ROBAXIN) 500 MG tablet Take 1 tablet (500 mg total) by mouth 2 (two) times daily as needed for muscle spasms. 10 tablet 0  . naproxen (NAPROSYN) 500 MG tablet Take 1 tablet (500 mg total) by mouth 2 (two) times daily with a meal. Take every day for one week, then use as needed after that 60 tablet 1   No current facility-administered medications for this visit.   Allergies  Allergen Reactions  . Zoloft  [Sertraline Hcl] Other (See Comments)    suicidal   . Zolpidem Tartrate Other (See Comments)    hallucinations   . Lexapro [Escitalopram Oxalate] Other (See Comments)    Irritability

## 2020-02-11 ENCOUNTER — Encounter: Payer: Self-pay | Admitting: Osteopathic Medicine

## 2020-02-11 LAB — RPR: RPR Ser Ql: NONREACTIVE

## 2020-02-28 ENCOUNTER — Encounter: Payer: No Typology Code available for payment source | Admitting: Osteopathic Medicine

## 2020-03-10 ENCOUNTER — Encounter: Payer: Self-pay | Admitting: Osteopathic Medicine

## 2020-03-13 ENCOUNTER — Other Ambulatory Visit: Payer: Self-pay

## 2020-03-13 MED ORDER — BUPROPION HCL ER (XL) 150 MG PO TB24: 150.0000 mg | ORAL_TABLET | Freq: Every day | ORAL | 2 refills | Status: DC

## 2020-04-09 ENCOUNTER — Ambulatory Visit: Payer: Self-pay

## 2020-05-31 ENCOUNTER — Encounter: Payer: Self-pay | Admitting: Osteopathic Medicine

## 2020-06-01 LAB — TRICHOMONAS VAGINALIS RNA, QL,MALES: Trichomonas vaginalis RNA: NOT DETECTED

## 2020-06-01 LAB — C. TRACHOMATIS/N. GONORRHOEAE RNA
C. trachomatis RNA, TMA: NOT DETECTED
N. gonorrhoeae RNA, TMA: NOT DETECTED

## 2020-06-01 LAB — HIV ANTIBODY (ROUTINE TESTING W REFLEX): HIV 1&2 Ab, 4th Generation: NONREACTIVE

## 2020-06-06 ENCOUNTER — Encounter: Payer: Self-pay | Admitting: Osteopathic Medicine

## 2020-06-14 ENCOUNTER — Encounter: Payer: Self-pay | Admitting: Osteopathic Medicine

## 2020-06-20 ENCOUNTER — Encounter: Payer: Self-pay | Admitting: Osteopathic Medicine

## 2020-06-20 ENCOUNTER — Encounter (INDEPENDENT_AMBULATORY_CARE_PROVIDER_SITE_OTHER): Payer: Self-pay

## 2020-06-24 ENCOUNTER — Encounter: Payer: Self-pay | Admitting: Osteopathic Medicine

## 2020-07-26 ENCOUNTER — Encounter: Payer: Self-pay | Admitting: Osteopathic Medicine

## 2020-07-27 ENCOUNTER — Encounter: Payer: Self-pay | Admitting: Osteopathic Medicine

## 2020-10-18 ENCOUNTER — Encounter: Payer: Self-pay | Admitting: Physician Assistant

## 2020-10-18 ENCOUNTER — Telehealth (INDEPENDENT_AMBULATORY_CARE_PROVIDER_SITE_OTHER): Payer: 59 | Admitting: Physician Assistant

## 2020-10-18 VITALS — Ht 69.0 in | Wt 172.0 lb

## 2020-10-18 DIAGNOSIS — R110 Nausea: Secondary | ICD-10-CM | POA: Diagnosis not present

## 2020-10-18 DIAGNOSIS — R1013 Epigastric pain: Secondary | ICD-10-CM

## 2020-10-18 DIAGNOSIS — R197 Diarrhea, unspecified: Secondary | ICD-10-CM

## 2020-10-18 MED ORDER — ONDANSETRON 8 MG PO TBDP: 8.0000 mg | ORAL_TABLET | Freq: Three times a day (TID) | ORAL | 1 refills | Status: DC | PRN

## 2020-10-18 NOTE — Progress Notes (Signed)
Patient ID: Gabriel Shelton, male   DOB: 1981-02-27, 40 y.o.   MRN: [id number] .Marland KitchenVirtual Visit via Video Note  I connected with Gabriel Shelton on 10/18/20 at 10:50 AM EST by a video enabled telemedicine application and verified that I am speaking with the correct person using two identifiers.  Location: Patient: home Provider: clinic  .Marland KitchenParticipating in visit:  Patient: Gabriel Shelton Provider: Tandy Gaw PA-C Provider in training: Abbott Pao PA-Student   I discussed the limitations of evaluation and management by telemedicine and the availability of in person appointments. The patient expressed understanding and agreed to proceed.  History of Present Illness: Patient is a 40 year old obese male who calls into the clinic with nausea, dry heaving, diarrhea, epigastric pain that started at 4 AM this morning and woke him up.  He attempted to go to work but he felt like he could not guarantee he would not vomit.  He has taken some Imodium and seemed to help a little but the epigastric pain still persist.  His stools are more loose.  He denies any melena or hematochezia.  He denies any upper respiratory symptoms, loss of smell or taste, cough, shortness of breath.  He has no Covid contacts.  There has been no changes in diet.  He has not started any new medications.  No one in his household has similar symptoms.  He has been Covid vaccinated with Materna and booster.  He is sending off his PCR test for Covid now.  He is going to go to [facility] and try to find a rapid Covid test.  .. Active Ambulatory Problems    Diagnosis Date Noted  . Lumbar radiculopathy 03/02/2014  . Abdominal pain, unspecified site 03/02/2014  . Hearing loss 03/02/2014  . 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 Generalized anxiety disorder 03/03/2014  . Cholesteatoma of attic of left ear 03/03/2014  . Major depressive disorder, recurrent episode, unspecified 03/17/2014  . Social anxiety disorder 03/17/2014  . GERD  (gastroesophageal reflux disease)   . Radiculitis of right cervical region 05/16/2014  . Distal biceps tendinitis on left 06/29/2014  . Hyperlipidemia 09/27/2014  . MDD (major depressive disorder), recurrent episode (HCC) 01/21/2020  . Mild alcohol use disorder 01/22/2020   Resolved Ambulatory Problems    Diagnosis Date Noted  . No Resolved Ambulatory Problems   Past Medical History:  Diagnosis Date  . Anxiety   . Depression   . Kidney stone    Reviewed med, allergy, problem list.    Observations/Objective: No acute distress Normal mood and appearance.  No cough or wheezing Points to epigastric area where he is hurting.   .. Today's Vitals   10/18/20 1023  Weight: 172 lb (78 kg)  Height: 5\' 9"  (1.753 m)   Body mass index is 25.4 kg/m.    Assessment and Plan: Marland KitchenThuan was seen today for diarrhea and vomiting.  Diagnoses and all orders for this visit:  Nausea -     ondansetron (ZOFRAN-ODT) 8 MG disintegrating tablet; Take 1 tablet (8 mg total) by mouth every 8 (eight) hours as needed for nausea.  Epigastric pain  Diarrhea, unspecified type    Pt is covid vaccinated fully. He has no URI symptoms or fever. Likely more viral gastroenteritis. He will get rapid covid test and is negative and symptoms resolved in 48hrs then will be released to go back to work. If symptoms worsen then needs to remain quarantined until PCR returns. Keep BRAT diet. zofran for nausea sent to the pharmacy. Keep hydrated.  If new symptoms occur or covid positive let us know for recommendations on treatment and return to work.    Follow Up Instructions:    I discussed the assessment and treatment plan with the patient. The patient was provided an opportunity to ask questions and all were answered. The patient agreed with the plan and demonstrated an understanding of the instructions.   The patient was advised to call back or seek an in-person evaluation if the symptoms worsen or if the  condition fails to improve as anticipated.    Tandy Gaw, PA-C

## 2020-10-18 NOTE — Progress Notes (Deleted)
Symptoms started at 4 am Nausea, diarrhea, no vomiting (dry heaving) Feels like a "knot" in stomach No changes in diet No new medications  No other symptoms  Took some imodium for symptoms, helped some but still having pain

## 2020-10-19 NOTE — Telephone Encounter (Signed)
Note completed 

## 2020-11-30 ENCOUNTER — Encounter: Payer: 59 | Admitting: Osteopathic Medicine

## 2020-12-27 ENCOUNTER — Encounter: Payer: Self-pay | Admitting: Osteopathic Medicine

## 2020-12-27 ENCOUNTER — Ambulatory Visit (INDEPENDENT_AMBULATORY_CARE_PROVIDER_SITE_OTHER): Payer: PRIVATE HEALTH INSURANCE | Admitting: Osteopathic Medicine

## 2020-12-27 ENCOUNTER — Other Ambulatory Visit: Payer: Self-pay

## 2020-12-27 VITALS — BP 154/91 | HR 83 | Temp 97.8°F | Wt 182.1 lb

## 2020-12-27 DIAGNOSIS — F411 Generalized anxiety disorder: Secondary | ICD-10-CM | POA: Diagnosis not present

## 2020-12-27 DIAGNOSIS — Z113 Encounter for screening for infections with a predominantly sexual mode of transmission: Secondary | ICD-10-CM | POA: Diagnosis not present

## 2020-12-27 DIAGNOSIS — Z569 Unspecified problems related to employment: Secondary | ICD-10-CM

## 2020-12-27 DIAGNOSIS — Z0000 Encounter for general adult medical examination without abnormal findings: Secondary | ICD-10-CM

## 2020-12-27 DIAGNOSIS — Z5689 Other problems related to employment: Secondary | ICD-10-CM

## 2020-12-27 NOTE — Patient Instructions (Signed)
General Preventive Care  Most recent routine screening labs: ordered today.   Blood pressure goal 130/80 or less.   Tobacco: don't! Please let me know if you need help quitting!  Alcohol: responsible moderation is ok for most adults - if you have concerns about your alcohol intake, please talk to me!   Exercise: as tolerated to reduce risk of cardiovascular disease and diabetes. Strength training will also prevent osteoporosis.   Mental health: if need for mental health care (medicines, counseling, other), or concerns about moods, please let me know!   Sexual / Reproductive health: if need for STD testing, or if concerns with libido/pain problems, please let me know! If you need to discuss family planning, please let me know!   Advanced Directive: Living Will and/or Healthcare Power of Attorney recommended for all adults, regardless of age or health.  Vaccines  Flu vaccine: for almost everyone, every fall.   Shingles vaccine: after age 75.   Pneumonia vaccines: after age 42.  Tetanus booster: every 10 years due 2031  HPV vaccine: Gardasil up to age 40 to prevent HPV-associated diseases, including certain cancers.   COVID vaccine: THANKS for getting your vaccine! :)  Cancer screenings   Colon cancer screening: for everyone age 41-75.   Prostate cancer screening: PSA blood test age 13-71  Lung cancer screening: CT chest every year for those aged 29 to 44 years who have a 20 pack-year smoking history and currently smoke or have quit within the past 15 years  Infection screenings  . HIV: recommended screening at least once age 59-65, more often as needed. Gabriel Shelton, other STI: screening as needed . Hepatitis C: recommended once for everyone age 66-75 . TB: certain at-risk populations, or depending on work requirements and/or travel history Other . Bone Density Test: recommended for men at age 87 . Abdominal Aortic Aneurysm: screening with ultrasound recommended  once for men age 48-75 who have ever smoked

## 2020-12-27 NOTE — Progress Notes (Signed)
Gabriel Shelton is a 40 y.o. male who presents to  [facility] at [facility]  today, 12/29/20, seeking care for the following:  . Annual physical   Requests drug/alcohol screen for occupational health, he is trying to clear up of a previous diagnosis of alcohol use disorder, he states he drinks alcohol occasionally, no history of withdrawal symptoms    ASSESSMENT & PLAN with other pertinent findings:  The primary encounter diagnosis was Annual physical exam. Diagnoses of Generalized anxiety disorder, Occupational health problem, and Routine screening for STI (sexually transmitted infection) were also pertinent to this visit.    Patient Instructions  General Preventive Care  Most recent routine screening labs: ordered today.   Blood pressure goal 130/80 or less.   Tobacco: don't! Please let me know if you need help quitting!  Alcohol: responsible moderation is ok for most adults - if you have concerns about your alcohol intake, please talk to me!   Exercise: as tolerated to reduce risk of cardiovascular disease and diabetes. Strength training will also prevent osteoporosis.   Mental health: if need for mental health care (medicines, counseling, other), or concerns about moods, please let me know!   Sexual / Reproductive health: if need for STD testing, or if concerns with libido/pain problems, please let me know! If you need to discuss family planning, please let me know!   Advanced Directive: Living Will and/or Healthcare Power of Attorney recommended for all adults, regardless of age or health.  Vaccines  Flu vaccine: for almost everyone, every fall.   Shingles vaccine: after age 16.   Pneumonia vaccines: after age 38.  Tetanus booster: every 10 years due 2031  HPV vaccine: Gardasil up to age 43 to prevent HPV-associated diseases, including certain cancers.   COVID vaccine: THANKS for getting your vaccine! :)  Cancer screenings    Colon cancer screening: for everyone age 28-75.   Prostate cancer screening: PSA blood test age 38-71  Lung cancer screening: CT chest every year for those aged 72 to 83 years who have a 20 pack-year smoking history and currently smoke or have quit within the past 15 years  Infection screenings  . HIV: recommended screening at least once age 47-65, more often as needed. Nanetta Batty, other STI: screening as needed . Hepatitis C: recommended once for everyone age 85-75 . TB: certain at-risk populations, or depending on work requirements and/or travel history Other . Bone Density Test: recommended for men at age 9 . Abdominal Aortic Aneurysm: screening with ultrasound recommended once for men age 3-75 who have ever smoked    Orders Placed This Encounter  Procedures  . C. trachomatis/N. gonorrhoeae RNA  . CBC  . COMPLETE METABOLIC PANEL WITH GFR  . Lipid panel  . HIV Antibody (routine testing w rflx)  . Hepatitis C antibody  . RPR  . Drugs of abuse screen w/o alc (for BH OP)  . Drug Monitor,Alcoholmetab,w/Conf,Urine  . DM TEMPLATE    No orders of the defined types were placed in this encounter.    See below for relevant physical exam findings  See below for recent lab and imaging results reviewed  Medications, allergies, PMH, PSH, SocH, FamH reviewed below    Follow-up instructions: Return in about 1 year (around 12/27/2021) for [place] (call week prior to visit for lab orders).  Exam:  BP (!) 154/91 (BP Location: Left Arm, Patient Position: Sitting, Cuff Size: Normal)   Pulse 83   Temp 97.8 F (36.6 C) (Oral)   Wt 182 lb 1.9 oz (82.6 kg)   BMI 26.89 kg/m  Note: Patient reports increased stress today, notes normal blood pressures at home under 130/90  Constitutional: VS see above. General Appearance: alert, well-developed, well-nourished, NAD  Neck: No masses, trachea midline.    Respiratory: Normal respiratory effort. no wheeze, no rhonchi, no rales  Cardiovascular: S1/S2 normal, no murmur, no rub/gallop auscultated. RRR.   Musculoskeletal: Gait normal. Symmetric and independent movement of all extremities  Abdominal: non-tender, non-distended, no appreciable organomegaly, neg Murphy's, BS WNLx4  Neurological: Normal balance/coordination. No tremor.  Skin: warm, dry, intact.   Psychiatric: Normal judgment/insight. Normal mood and affect. Oriented x3.   Current Meds  Medication Sig  . buPROPion (WELLBUTRIN XL) 150 MG 24 hr tablet Take 1 tablet (150 mg total) by mouth daily.    Allergies  Allergen Reactions  . Zoloft [Sertraline Hcl] Other (See Comments)    suicidal   . Zolpidem Tartrate Other (See Comments)    hallucinations   . Lexapro [Escitalopram Oxalate] Other (See Comments)    Irritability     Patient Active Problem List   Diagnosis Date Noted  . Mild alcohol use disorder in remission 01/22/2020  . MDD (major depressive disorder), recurrent episode (HCC) 01/21/2020  . Hyperlipidemia 09/27/2014  . Distal biceps tendinitis on left 06/29/2014  . Radiculitis of right cervical region 05/16/2014  . GERD (gastroesophageal reflux disease)   . Major depressive disorder, recurrent episode, unspecified 03/17/2014  . Social anxiety disorder 03/17/2014  . Generalized anxiety disorder 03/03/2014  . Cholesteatoma of attic of left ear 03/03/2014  . Lumbar radiculopathy 03/02/2014  . Abdominal pain, unspecified site 03/02/2014  . Hearing loss 03/02/2014  . 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 Healthy Mother   . Heart failure Father   . Bipolar disorder Neg Hx   . Depression Neg Hx     Social History   Tobacco Use  Smoking Status Current Every Day Smoker  . Packs/day: 1.00  . Years: 12.00  . Pack years: 12.00  Smokeless Tobacco Never Used    Past Surgical History:  Procedure  Laterality Date  . INNER EAR SURGERY    . LITHOTRIPSY      Immunization History  Administered Date(s) Administered  . Influenza,inj,Quad PF,6+ Mos 06/03/2017, 06/09/2018  . Influenza-Unspecified 07/24/2014, 07/25/2015, 07/27/2016, 09/13/2016, 06/06/2020  . Moderna Sars-Covid-2 Vaccination 01/11/2020, 02/08/2020, 07/27/2020  . Pneumococcal Conjugate-13 06/24/2020  . Tdap 09/26/2014, 06/20/2020    Recent Results (from the past 2160 hour(s))  CBC     Status: Abnormal   Collection Time: 12/27/20 12:00 AM  Result Value Ref Range   WBC 9.3 3.8 - 10.8 Thousand/uL   RBC 5.08 4.20 - 5.80 Million/uL   Hemoglobin 16.2 13.2 - 17.1 g/dL   HCT 11.946.7 14.738.5 - 82.950.0 %   MCV 91.9 80.0 - 100.0 fL   MCH 31.9 27.0 - 33.0 pg   MCHC 34.7 32.0 - 36.0 g/dL   RDW 56.212.5 13.011.0 - 86.515.0 %   Platelets 428 (H) 140 - 400 Thousand/uL   MPV 9.1 7.5 - 12.5 fL  COMPLETE METABOLIC PANEL WITH GFR     Status: None   Collection Time: 12/27/20 12:00 AM  Result Value Ref Range   Glucose, Bld 83 65 - 99 mg/dL  Comment: .            Fasting reference interval .    BUN 15 7 - 25 mg/dL   Creat 0.98 1.19 - 1.47 mg/dL   GFR, Est Non African American 79 > OR = 60 mL/min/1.57m2   GFR, Est African American 92 > OR = 60 mL/min/1.74m2   BUN/Creatinine Ratio NOT APPLICABLE 6 - 22 (calc)   Sodium 138 135 - 146 mmol/L   Potassium 4.2 3.5 - 5.3 mmol/L   Chloride 105 98 - 110 mmol/L   CO2 23 20 - 32 mmol/L   Calcium 10.2 8.6 - 10.3 mg/dL   Total Protein 7.7 6.1 - 8.1 g/dL   Albumin 4.9 3.6 - 5.1 g/dL   Globulin 2.8 1.9 - 3.7 g/dL (calc)   AG Ratio 1.8 1.0 - 2.5 (calc)   Total Bilirubin 0.5 0.2 - 1.2 mg/dL   Alkaline phosphatase (APISO) 86 36 - 130 U/L   AST 22 10 - 40 U/L   ALT 33 9 - 46 U/L  Lipid panel     Status: Abnormal   Collection Time: 12/27/20 12:00 AM  Result Value Ref Range   Cholesterol 261 (H) <200 mg/dL   HDL 53 > OR = 40 mg/dL   Triglycerides 829 (H) <150 mg/dL    Comment: . If a non-fasting specimen  was collected, consider repeat triglyceride testing on a fasting specimen if clinically indicated.  Perry Mount et al. J. of Clin. Lipidol. 2015;9:129-169. Marland Kitchen    LDL Cholesterol (Calc) 171 (H) mg/dL (calc)    Comment: Reference range: <100 . Desirable range <100 mg/dL for primary prevention;   <70 mg/dL for patients with CHD or diabetic patients  with > or = 2 CHD risk factors. Marland Kitchen LDL-C is now calculated using the Martin-Hopkins  calculation, which is a validated novel method providing  better accuracy than the Friedewald equation in the  estimation of LDL-C.  Horald Pollen et al. Lenox Ahr. 5621;308(65): 2061-2068  (http://education.[facility].com/faq/FAQ164)    Total CHOL/HDL Ratio 4.9 <5.0 (calc)   Non-HDL Cholesterol (Calc) 208 (H) <130 mg/dL (calc)    Comment: For patients with diabetes plus 1 major ASCVD risk  factor, treating to a non-HDL-C goal of <100 mg/dL  (LDL-C of <78 mg/dL) is considered a therapeutic  option.   HIV Antibody (routine testing w rflx)     Status: None   Collection Time: 12/27/20 12:00 AM  Result Value Ref Range   HIV 1&2 Ab, 4th Generation NON-REACTIVE NON-REACTI    Comment: HIV-1 antigen and HIV-1/HIV-2 antibodies were not detected. There is no laboratory evidence of HIV infection. Marland Kitchen PLEASE NOTE: This information has been disclosed to you from records whose confidentiality may be protected by state law.  If your state requires such protection, then the state law prohibits you from making any further disclosure of the information without the specific written consent of the person to whom it pertains, or as otherwise permitted by law. A general authorization for the release of medical or other information is NOT sufficient for this purpose. . For additional information please refer to http://education.[facility].com/faq/FAQ106 (This link is being provided for informational/ educational purposes only.) . Marland Kitchen The performance of this assay has  not been clinically validated in patients less than 44 years old. .   Hepatitis C antibody     Status: None   Collection Time: 12/27/20 12:00 AM  Result Value Ref Range   Hepatitis C Ab NON-REACTIVE NON-REACTI   SIGNAL TO CUT-OFF 0.01 <  1.00    Comment: . HCV antibody was non-reactive. There is no laboratory  evidence of HCV infection. . In most cases, no further action is required. However, if recent HCV exposure is suspected, a test for HCV RNA (test code 88916) is suggested. . For additional information please refer to http://education.[facility].com/faq/FAQ22v1 (This link is being provided for informational/ educational purposes only.) .   RPR     Status: None   Collection Time: 12/27/20 12:00 AM  Result Value Ref Range   RPR Ser Ql NON-REACTIVE NON-REACTI  C. trachomatis/N. gonorrhoeae RNA     Status: None   Collection Time: 12/27/20 12:00 AM   Specimen: Urine  Result Value Ref Range   C. trachomatis RNA, TMA NOT DETECTED NOT DETECT   N. gonorrhoeae RNA, TMA NOT DETECTED NOT DETECT    Comment: The analytical performance characteristics of this assay, when used to test SurePath(TM) specimens have been determined by [employer]. The modifications have not been cleared or approved by the FDA. This assay has been validated pursuant to the CLIA regulations and is used for clinical purposes. . For additional information, please refer to https://education.[facility].com/faq/FAQ154 (This link is being provided for information/ educational purposes only.) .     No results found.     All questions at time of visit were answered - patient instructed to contact office with any additional concerns or updates. ER/RTC precautions were reviewed with the patient as applicable.   Please note: manual typing as well as voice recognition software may have been used to produce this document - typos may escape review. Please contact Dr. Lyn Hollingshead for any needed  clarifications.

## 2020-12-28 ENCOUNTER — Encounter: Payer: Self-pay | Admitting: Osteopathic Medicine

## 2020-12-29 ENCOUNTER — Encounter: Payer: Self-pay | Admitting: Osteopathic Medicine

## 2020-12-29 LAB — HIV ANTIBODY (ROUTINE TESTING W REFLEX): HIV 1&2 Ab, 4th Generation: NONREACTIVE

## 2020-12-29 LAB — CBC
HCT: 46.7 % (ref 38.5–50.0)
Hemoglobin: 16.2 g/dL (ref 13.2–17.1)
MCH: 31.9 pg (ref 27.0–33.0)
MCHC: 34.7 g/dL (ref 32.0–36.0)
MCV: 91.9 fL (ref 80.0–100.0)
MPV: 9.1 fL (ref 7.5–12.5)
Platelets: 428 10*3/uL — ABNORMAL HIGH (ref 140–400)
RBC: 5.08 10*6/uL (ref 4.20–5.80)
RDW: 12.5 % (ref 11.0–15.0)
WBC: 9.3 10*3/uL (ref 3.8–10.8)

## 2020-12-29 LAB — COMPLETE METABOLIC PANEL WITH GFR
AG Ratio: 1.8 (calc) (ref 1.0–2.5)
ALT: 33 U/L (ref 9–46)
AST: 22 U/L (ref 10–40)
Albumin: 4.9 g/dL (ref 3.6–5.1)
Alkaline phosphatase (APISO): 86 U/L (ref 36–130)
BUN: 15 mg/dL (ref 7–25)
CO2: 23 mmol/L (ref 20–32)
Calcium: 10.2 mg/dL (ref 8.6–10.3)
Chloride: 105 mmol/L (ref 98–110)
Creat: 1.16 mg/dL (ref 0.60–1.35)
GFR, Est African American: 92 mL/min/{1.73_m2} (ref >=60)
GFR, Est Non African American: 79 mL/min/{1.73_m2} (ref >=60)
Globulin: 2.8 g/dL (calc) (ref 1.9–3.7)
Glucose, Bld: 83 mg/dL (ref 65–99)
Potassium: 4.2 mmol/L (ref 3.5–5.3)
Sodium: 138 mmol/L (ref 135–146)
Total Bilirubin: 0.5 mg/dL (ref 0.2–1.2)
Total Protein: 7.7 g/dL (ref 6.1–8.1)

## 2020-12-29 LAB — LIPID PANEL
Cholesterol: 261 mg/dL — ABNORMAL HIGH (ref <200)
HDL: 53 mg/dL (ref >=40)
LDL Cholesterol (Calc): 171 mg/dL (calc) — ABNORMAL HIGH
Non-HDL Cholesterol (Calc): 208 mg/dL (calc) — ABNORMAL HIGH (ref <130)
Total CHOL/HDL Ratio: 4.9 (calc) (ref <5.0)
Triglycerides: 205 mg/dL — ABNORMAL HIGH (ref <150)

## 2020-12-29 LAB — HEPATITIS C ANTIBODY
Hepatitis C Ab: NONREACTIVE
SIGNAL TO CUT-OFF: 0.01 (ref <1.00)

## 2020-12-29 LAB — DRUG MONITOR, ALCOHOLMETAB, W/CONF, URINE
Alcohol Metabolites: POSITIVE ng/mL — AB
Ethyl Glucuronide (ETG): 7996 ng/mL — ABNORMAL HIGH (ref <500)
Ethyl Sulfate (ETS): 465 ng/mL — ABNORMAL HIGH (ref <100)

## 2020-12-29 LAB — RPR: RPR Ser Ql: NONREACTIVE

## 2020-12-29 LAB — DM TEMPLATE

## 2020-12-29 LAB — C. TRACHOMATIS/N. GONORRHOEAE RNA
C. trachomatis RNA, TMA: NOT DETECTED
N. gonorrhoeae RNA, TMA: NOT DETECTED

## 2021-03-10 ENCOUNTER — Encounter: Payer: Self-pay | Admitting: Osteopathic Medicine

## 2021-03-12 ENCOUNTER — Other Ambulatory Visit: Payer: Self-pay

## 2021-03-12 MED ORDER — BUPROPION HCL ER (XL) 150 MG PO TB24: 150.0000 mg | ORAL_TABLET | Freq: Every day | ORAL | 3 refills | Status: DC

## 2021-04-20 DIAGNOSIS — Z9089 Acquired absence of other organs: Secondary | ICD-10-CM | POA: Insufficient documentation

## 2021-05-28 ENCOUNTER — Other Ambulatory Visit: Payer: Self-pay | Admitting: Osteopathic Medicine

## 2021-05-29 NOTE — Telephone Encounter (Signed)
[facility] pharmacy requesting med refill on opthalmologic drops. Rx not listed in active med list.

## 2021-08-05 ENCOUNTER — Encounter: Payer: Self-pay | Admitting: Physician Assistant

## 2021-09-19 ENCOUNTER — Other Ambulatory Visit: Payer: Self-pay

## 2021-09-19 ENCOUNTER — Ambulatory Visit
Admission: EM | Admit: 2021-09-19 | Discharge: 2021-09-19 | Disposition: A | Discharge status: Home / Self Care | Payer: No Typology Code available for payment source | Attending: Emergency Medicine | Admitting: Emergency Medicine

## 2021-09-19 DIAGNOSIS — R051 Acute cough: Secondary | ICD-10-CM | POA: Diagnosis not present

## 2021-09-19 DIAGNOSIS — M7521 Bicipital tendinitis, right shoulder: Secondary | ICD-10-CM | POA: Diagnosis not present

## 2021-09-19 DIAGNOSIS — J019 Acute sinusitis, unspecified: Secondary | ICD-10-CM

## 2021-09-19 DIAGNOSIS — F17200 Nicotine dependence, unspecified, uncomplicated: Secondary | ICD-10-CM | POA: Diagnosis not present

## 2021-09-19 MED ORDER — IPRATROPIUM BROMIDE 0.06 % NA SOLN: 2.0000 | Freq: Four times a day (QID) | NASAL | 0 refills | Status: DC

## 2021-09-19 MED ORDER — METHYLPREDNISOLONE 4 MG PO TBPK: ORAL_TABLET | ORAL | 0 refills | Status: DC

## 2021-09-19 MED ORDER — PROMETHAZINE-DM 6.25-15 MG/5ML PO SYRP: 5.0000 mL | ORAL_SOLUTION | Freq: Four times a day (QID) | ORAL | 0 refills | Status: DC | PRN

## 2021-09-19 MED ORDER — PSEUDOEPHEDRINE HCL 30 MG PO TABS: 60.0000 mg | ORAL_TABLET | Freq: Three times a day (TID) | ORAL | 0 refills | Status: AC | PRN

## 2021-09-19 NOTE — ED Provider Notes (Signed)
[facility]    CSN: [id number] Arrival date & time: 09/19/21  1149    HISTORY  No chief complaint on file.  HPI Gabriel Shelton is a 40 y.o. male. Pt c/o cough with drainage that started a few days ago.  Patient is afebrile on arrival today with normal oxygenation, normal pulse rate and normal respirations.  Patient smokes 1 pack of cigarettes per day.  Patient also complains of some tendinitis in his right shoulder, states he ices it every night before bed but has not had a significant improvement.  Patient states his pain has been going on for about 3 weeks.  Patient demonstrates full range of motion during visit today while we talk.  Patient states he works at [employer] and does a lot of lifting.  The history is provided by the patient.  Past Medical History:  Diagnosis Date   Anxiety    Depression    GERD (gastroesophageal reflux disease)    Kidney stone    Patient Active Problem List   Diagnosis Date Noted   Mild alcohol use disorder in remission 01/22/2020   MDD (major depressive disorder), recurrent episode (HCC) 01/21/2020   Hyperlipidemia 09/27/2014   Distal biceps tendinitis on left 06/29/2014   Radiculitis of right cervical region 05/16/2014   GERD (gastroesophageal reflux disease)    Major depressive disorder, recurrent episode, unspecified 03/17/2014   Social anxiety disorder 03/17/2014   Generalized anxiety disorder 03/03/2014   Cholesteatoma of attic of left ear 03/03/2014   Lumbar radiculopathy 03/02/2014   Abdominal pain, unspecified site 03/02/2014   Hearing loss 03/02/2014   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 Authorizing Provider  buPROPion (WELLBUTRIN XL) 150 MG 24 hr tablet Take 1 tablet (150 mg total) by mouth daily. 03/12/21   Sunnie Nielsen, DO    Family History Family History  Problem Relation Age of Onset   Healthy Mother    Heart failure Father    Bipolar disorder Neg Hx    Depression Neg Hx    Social History Social History   Tobacco Use   Smoking status: Every Day    Packs/day: 1.00    Years: 12.00    Pack years: 12.00    Types: Cigarettes   Smokeless tobacco: Never  Vaping Use   Vaping Use: Former   Quit date: 09/02/2018  Substance Use Topics   Alcohol use: No   Drug use: No   Allergies   Zoloft [sertraline hcl], Zolpidem tartrate, and Lexapro [escitalopram oxalate]  Review of Systems Review of Systems Pertinent findings noted in history of present illness.   Physical Exam Triage Vital Signs ED Triage Vitals  Enc Vitals Group     BP 07/20/21 0827 (!) 147/82     Pulse Rate 07/20/21 0827 72     Resp 07/20/21 0827 18     Temp 07/20/21 0827 98.3 F (36.8 C)     Temp Source 07/20/21 0827 Oral     SpO2 07/20/21 0827 98 %     Weight --      Height --      Head Circumference --      Peak Flow --      Pain Score 07/20/21 0826 5     Pain Loc --  Pain Edu? --      Excl. in GC? --   No data found.  Updated Vital Signs BP (!) 142/91 (BP Location: Left Arm)    Pulse 83    Temp 98.4 F (36.9 C) (Oral)    Resp 20    SpO2 96%   Physical Exam Vitals and nursing note reviewed.  Constitutional:      General: He is not in acute distress.    Appearance: Normal appearance. He is not ill-appearing.  HENT:     Head: Normocephalic and atraumatic.     Salivary Glands: Right salivary gland is not diffusely enlarged or tender. Left salivary gland is not diffusely enlarged or tender.     Right Ear: Ear canal and external ear normal. No drainage. A middle ear effusion is present. There is no impacted cerumen. Tympanic membrane is bulging. Tympanic membrane is not injected or erythematous.     Left Ear: Ear canal and external ear normal. No drainage. A middle ear effusion is present. There is no impacted cerumen.  Tympanic membrane is bulging. Tympanic membrane is not injected or erythematous.     Ears:     Comments: Bilateral EACs normal, both TMs bulging with clear fluid    Nose: Rhinorrhea present. No nasal deformity, septal deviation, signs of injury, nasal tenderness, mucosal edema or congestion. Rhinorrhea is clear.     Right Nostril: Occlusion present. No foreign body, epistaxis or septal hematoma.     Left Nostril: Occlusion present. No foreign body, epistaxis or septal hematoma.     Right Turbinates: Enlarged, swollen and pale.     Left Turbinates: Enlarged, swollen and pale.     Right Sinus: No maxillary sinus tenderness or frontal sinus tenderness.     Left Sinus: No maxillary sinus tenderness or frontal sinus tenderness.     Mouth/Throat:     Lips: Pink. No lesions.     Mouth: Mucous membranes are moist. No oral lesions.     Pharynx: Oropharynx is clear. Uvula midline. No posterior oropharyngeal erythema or uvula swelling.     Tonsils: No tonsillar exudate. 0 on the right. 0 on the left.     Comments: Postnasal drip Eyes:     General: Lids are normal.        Right eye: No discharge.        Left eye: No discharge.     Extraocular Movements: Extraocular movements intact.     Conjunctiva/sclera: Conjunctivae normal.     Right eye: Right conjunctiva is not injected.     Left eye: Left conjunctiva is not injected.  Neck:     Trachea: Trachea and phonation normal.  Cardiovascular:     Rate and Rhythm: Normal rate and regular rhythm.     Pulses: Normal pulses.     Heart sounds: Normal heart sounds. No murmur heard.   No friction rub. No gallop.  Pulmonary:     Effort: Pulmonary effort is normal. No accessory muscle usage, prolonged expiration or respiratory distress.     Breath sounds: Normal breath sounds. No stridor, decreased air movement or transmitted upper airway sounds. No decreased breath sounds, wheezing, rhonchi or rales.  Chest:     Chest wall: No tenderness.   Musculoskeletal:        General: Tenderness (Right biceps insertion) present. Normal range of motion.     Cervical back: Normal range of motion and neck supple. Normal range of motion.  Lymphadenopathy:     Cervical: No cervical  adenopathy.  Skin:    General: Skin is warm and dry.     Findings: No erythema or rash.  Neurological:     General: No focal deficit present.     Mental Status: He is alert and oriented to person, place, and time.  Psychiatric:        Mood and Affect: Mood normal.        Behavior: Behavior normal.    Visual Acuity Right Eye Distance:   Left Eye Distance:   Bilateral Distance:    Right Eye Near:   Left Eye Near:    Bilateral Near:     UC Couse / Diagnostics / Procedures:    EKG  Radiology No results found.  Procedures Procedures (including critical care time)  UC Diagnoses / Final Clinical Impressions(s)   I have reviewed the triage vital signs and the nursing notes.  Pertinent labs & imaging results that were available during my care of the patient were reviewed by me and considered in my medical decision making (see chart for details).   Final diagnoses:  Tendinitis of upper biceps tendon of right shoulder  Acute rhinosinusitis  Tobacco dependence  Acute cough   Medrol Dosepak provided for tendinitis as well as relief of inflammation of upper respiratory tract.  Patient also provided with prescription for Atrovent, Sudafed and Promethazine DM.  Return precautions advised.  ED Prescriptions     Medication Sig Dispense Auth. Provider   promethazine-dextromethorphan (PROMETHAZINE-DM) 6.25-15 MG/5ML syrup Take 5 mLs by mouth 4 (four) times daily as needed for cough. 180 mL Theadora Rama Scales, PA-C   ipratropium (ATROVENT) 0.06 % nasal spray Place 2 sprays into both nostrils 4 (four) times daily. As needed for nasal congestion, runny nose 15 mL Theadora Rama Scales, PA-C   methylPREDNISolone (MEDROL DOSEPAK) 4 MG TBPK tablet Take 24 mg  on day 1, 20 mg on day 2, 16 mg on day 3, 12 mg on day 4, 8 mg on day 5, 4 mg on day 6. 21 tablet Theadora Rama Scales, PA-C   pseudoephedrine (SUDAFED) 30 MG tablet Take 2 tablets (60 mg total) by mouth every 8 (eight) hours as needed for up to 3 days for congestion. 18 tablet Theadora Rama Scales, PA-C      PDMP not reviewed this encounter.  Pending results:  Labs Reviewed - No data to display  Medications Ordered in UC: Medications - No data to display  Disposition Upon Discharge:  Condition: stable for discharge home Home: take medications as prescribed; routine discharge instructions as discussed; follow up as advised.  Patient presented with an acute illness with associated systemic symptoms and significant discomfort requiring urgent management. In my opinion, this is a condition that a prudent lay person (someone who possesses an average knowledge of health and medicine) may potentially expect to result in complications if not addressed urgently such as respiratory distress, impairment of bodily function or dysfunction of bodily organs.   Routine symptom specific, illness specific and/or disease specific instructions were discussed with the patient and/or caregiver at length.   As such, the patient has been evaluated and assessed, work-up was performed and treatment was provided in alignment with urgent care protocols and evidence based medicine.  Patient/parent/caregiver has been advised that the patient may require follow up for further testing and treatment if the symptoms continue in spite of treatment, as clinically indicated and appropriate.  If the patient was tested for COVID-19, Influenza and/or RSV, then the patient/parent/guardian was advised to  isolate at home pending the results of his/her diagnostic coronavirus test and potentially longer if theyre positive. I have also advised pt that if his/her COVID-19 test returns positive, it's recommended to self-isolate for at  least 10 days after symptoms first appeared AND until fever-free for 24 hours without fever reducer AND other symptoms have improved or resolved. Discussed self-isolation recommendations as well as instructions for household member/close contacts as per the [facility] and [place] DHHS, and also gave patient the COVID packet with this information.  Patient/parent/caregiver has been advised to return to the [facility] or PCP in 3-5 days if no better; to PCP or the Emergency Department if new signs and symptoms develop, or if the current signs or symptoms continue to change or worsen for further workup, evaluation and treatment as clinically indicated and appropriate  The patient will follow up with their current PCP if and as advised. If the patient does not currently have a PCP we will assist them in obtaining one.   The patient may need specialty follow up if the symptoms continue, in spite of conservative treatment and management, for further workup, evaluation, consultation and treatment as clinically indicated and appropriate.  Patient/parent/caregiver verbalized understanding and agreement of plan as discussed.  All questions were addressed during visit.  Please see discharge instructions below for further details of plan.  Discharge Instructions:   Discharge Instructions      Your symptoms and physical exam findings are concerning for a viral respiratory infection.   Please see the list below for recommended medications, dosages and frequencies to provide relief of your current symptoms:      Promethazine DM: Promethazine is both the nasal decongestant and an antinausea medication that makes most patients feel fairly sleepy.  The DM is dextromethorphan, a cough suppressant found many over-the-counter cough medications.  Please take 5 mL before bedtime to help you sleep better, minimize your cough.  I have provided you with a prescription for this medication.      Ipratropium (Atrovent): This is an excellent  nasal decongestant spray that does not cause rebound congestion, can be used up to 4 times daily as needed, instill 2 sprays into each nare with each use.  I have provided you with a prescription for this medication.      Methylprednisolone (Medrol Dosepak): This is a steroid that will significantly calm your upper and lower airways in addition to reducing inflammation in your right shoulder.  Please take one row of tablets daily with your breakfast meal starting tomorrow morning until the prescription is complete.         This office note has been dictated using Teaching laboratory technician.  Unfortunately, and despite my best efforts, this method of dictation can sometimes lead to occasional typographical or grammatical errors.  I apologize in advance if this occurs.      Theadora Rama Scales, PA-C 09/19/21 1300

## 2021-09-19 NOTE — Discharge Instructions (Signed)
Your symptoms and physical exam findings are concerning for a viral respiratory infection.   Please see the list below for recommended medications, dosages and frequencies to provide relief of your current symptoms:      Promethazine DM: Promethazine is both the nasal decongestant and an antinausea medication that makes most patients feel fairly sleepy.  The DM is dextromethorphan, a cough suppressant found many over-the-counter cough medications.  Please take 5 mL before bedtime to help you sleep better, minimize your cough.  I have provided you with a prescription for this medication.      Ipratropium (Atrovent): This is an excellent nasal decongestant spray that does not cause rebound congestion, can be used up to 4 times daily as needed, instill 2 sprays into each nare with each use.  I have provided you with a prescription for this medication.      Methylprednisolone (Medrol Dosepak): This is a steroid that will significantly calm your upper and lower airways in addition to reducing inflammation in your right shoulder.  Please take one row of tablets daily with your breakfast meal starting tomorrow morning until the prescription is complete.

## 2021-09-19 NOTE — ED Triage Notes (Signed)
Pt c/o cough with drainage that started a few days ago.

## 2021-11-19 ENCOUNTER — Telehealth: Payer: 59 | Admitting: Family Medicine

## 2021-11-19 ENCOUNTER — Encounter: Payer: Self-pay | Admitting: Family Medicine

## 2021-11-19 DIAGNOSIS — F331 Major depressive disorder, recurrent, moderate: Secondary | ICD-10-CM

## 2021-11-19 NOTE — Assessment & Plan Note (Signed)
Stable symptoms with current dose of bupropion.  No psychotic features or suicidal ideation.  Recommend continuation of bupropion at current strength.  Follow-up in 6 months.

## 2021-11-19 NOTE — Progress Notes (Signed)
Gabriel Shelton - 41 y.o. male MRN [id number]  Date of birth: [birth year]   This visit type was conducted due to national recommendations for restrictions regarding the COVID-19 Pandemic (e.g. social distancing).  This format is felt to be most appropriate for this patient at this time.  All issues noted in this document were discussed and addressed.  No physical exam was performed (except for noted visual exam findings with Video Visits).  I discussed the limitations of evaluation and management by telemedicine and the availability of in person appointments. The patient expressed understanding and agreed to proceed.  I connected withNAME@ on 11/19/21 at  1:30 PM EST by a video enabled telemedicine application and verified that I am speaking with the correct person using two identifiers.  Present at visit: Luetta Nutting, DO [name]   Patient Location: [place] [id number]   Provider location:   [place]  No chief complaint on file.   HPI  Gabriel Shelton is a 41 y.o. male who presents via audio/video conferencing for a telehealth visit today.  Following up today for history of depression and anxiety.  Current management with bupropion.  Reports he is doing well with this.  He is taking this daily.  He was hospitalized a couple of years ago due to IVC related to suicidal ideation.  He has not had any further suicidal ideation since that time.  Depression screen [facility] 2/9 12/27/2020 02/10/2020 09/02/2019  Decreased Interest 0 0 0  Down, Depressed, Hopeless 0 0 1  PHQ - 2 Score 0 0 1  Altered sleeping - 0 3  Tired, decreased energy - 0 0  Change in appetite - 0 0  Feeling bad or failure about yourself  - 0 0  Trouble concentrating - 0 0  Moving slowly or fidgety/restless - 0 0  Suicidal thoughts - 0 0  PHQ-9 Score - 0 4  Difficult doing work/chores - - -   GAD 7 : Generalized Anxiety Score 02/10/2020 09/02/2019 04/16/2019 03/11/2019  Nervous, Anxious, on Edge 0 0 1  0  Control/stop worrying 0 1 1 3   Worry too much - different things 0 0 0 3  Trouble relaxing 0 1 1 1   Restless 0 0 0 0  Easily annoyed or irritable 0 3 1 0  Afraid - awful might happen 0 0 0 0  Total GAD 7 Score 0 5 4 7   Anxiety Difficulty - Not difficult at all Not difficult at all Not difficult at all       ROS:  A comprehensive ROS was completed and negative except as noted per HPI  Past Medical History:  Diagnosis Date   Anxiety    Depression    GERD (gastroesophageal reflux disease)    Kidney stone     Past Surgical History:  Procedure Laterality Date   INNER EAR SURGERY     LITHOTRIPSY      Family History  Problem Relation Age of Onset   Healthy Mother    Heart failure Father    Bipolar disorder Neg Hx    Depression Neg Hx     Social History   Socioeconomic History   Marital status: Single    Spouse name: Not on file   Number of children: Not on file   Years of education: Not on file   Highest education level: Not on file  Occupational History   Not on file  Tobacco Use   Smoking status: Every  Day    Packs/day: 1.00    Years: 12.00    Pack years: 12.00    Types: Cigarettes   Smokeless tobacco: Never  Vaping Use   Vaping Use: Former   Quit date: 09/02/2018  Substance and Sexual Activity   Alcohol use: No   Drug use: No   Sexual activity: Yes  Other Topics Concern   Not on file  Social History Narrative   Not on file   Social Determinants of Health   Financial Resource Strain: Not on file  Food Insecurity: Not on file  Transportation Needs: Not on file  Physical Activity: Not on file  Stress: Not on file  Social Connections: Not on file  Intimate Partner Violence: Not on file     Current Outpatient Medications:    buPROPion (WELLBUTRIN XL) 150 MG 24 hr tablet, Take 1 tablet (150 mg total) by mouth daily., Disp: 90 tablet, Rfl: 3  EXAM:  VITALS per patient if applicable: There were no vitals taken for this visit.  GENERAL:  alert, oriented, appears well and in no acute distress  HEENT: atraumatic, conjunttiva clear, no obvious abnormalities on inspection of external nose and ears  NECK: normal movements of the head and neck  LUNGS: on inspection no signs of respiratory distress, breathing rate appears normal, no obvious gross SOB, gasping or wheezing  CV: no obvious cyanosis  MS: moves all visible extremities without noticeable abnormality  PSYCH/NEURO: pleasant and cooperative, no obvious depression or anxiety, speech and thought processing grossly intact  ASSESSMENT AND PLAN:  Discussed the following assessment and plan:  Major depressive disorder, recurrent episode (HCC) Stable symptoms with current dose of bupropion.  No psychotic features or suicidal ideation.  Recommend continuation of bupropion at current strength.  Follow-up in 6 months.     I discussed the assessment and treatment plan with the patient. The patient was provided an opportunity to ask questions and all were answered. The patient agreed with the plan and demonstrated an understanding of the instructions.   The patient was advised to call back or seek an in-person evaluation if the symptoms worsen or if the condition fails to improve as anticipated.    Luetta Nutting, DO

## 2021-12-05 ENCOUNTER — Ambulatory Visit: Payer: No Typology Code available for payment source | Admitting: Family Medicine

## 2021-12-26 ENCOUNTER — Emergency Department (HOSPITAL_BASED_OUTPATIENT_CLINIC_OR_DEPARTMENT_OTHER)
Admission: EM | Admit: 2021-12-26 | Discharge: 2021-12-26 | Disposition: A | Discharge status: Home / Self Care | Payer: 59 | Attending: Emergency Medicine | Admitting: Emergency Medicine

## 2021-12-26 ENCOUNTER — Encounter (HOSPITAL_BASED_OUTPATIENT_CLINIC_OR_DEPARTMENT_OTHER): Payer: Self-pay | Admitting: *Deleted

## 2021-12-26 DIAGNOSIS — R067 Sneezing: Secondary | ICD-10-CM | POA: Insufficient documentation

## 2021-12-26 DIAGNOSIS — R000 Tachycardia, unspecified: Secondary | ICD-10-CM | POA: Insufficient documentation

## 2021-12-26 DIAGNOSIS — R42 Dizziness and giddiness: Secondary | ICD-10-CM | POA: Diagnosis present

## 2021-12-26 DIAGNOSIS — R0989 Other specified symptoms and signs involving the circulatory and respiratory systems: Secondary | ICD-10-CM | POA: Diagnosis not present

## 2021-12-26 DIAGNOSIS — R197 Diarrhea, unspecified: Secondary | ICD-10-CM | POA: Insufficient documentation

## 2021-12-26 LAB — CBC WITH DIFFERENTIAL/PLATELET
Abs Immature Granulocytes: 0.03 10*3/uL (ref 0.00–0.07)
Basophils Absolute: 0 10*3/uL (ref 0.0–0.1)
Basophils Relative: 0 %
Eosinophils Absolute: 0.1 10*3/uL (ref 0.0–0.5)
Eosinophils Relative: 1 %
HCT: 45 % (ref 39.0–52.0)
Hemoglobin: 16 g/dL (ref 13.0–17.0)
Immature Granulocytes: 0 %
Lymphocytes Relative: 27 %
Lymphs Abs: 1.9 10*3/uL (ref 0.7–4.0)
MCH: 32.4 pg (ref 26.0–34.0)
MCHC: 35.6 g/dL (ref 30.0–36.0)
MCV: 91.1 fL (ref 80.0–100.0)
Monocytes Absolute: 0.6 10*3/uL (ref 0.1–1.0)
Monocytes Relative: 8 %
Neutro Abs: 4.5 10*3/uL (ref 1.7–7.7)
Neutrophils Relative %: 64 %
Platelets: 415 10*3/uL — ABNORMAL HIGH (ref 150–400)
RBC: 4.94 MIL/uL (ref 4.22–5.81)
RDW: 12.5 % (ref 11.5–15.5)
WBC: 7.2 10*3/uL (ref 4.0–10.5)
nRBC: 0 % (ref 0.0–0.2)

## 2021-12-26 LAB — COMPREHENSIVE METABOLIC PANEL
ALT: 51 U/L — ABNORMAL HIGH (ref 0–44)
AST: 33 U/L (ref 15–41)
Albumin: 4.4 g/dL (ref 3.5–5.0)
Alkaline Phosphatase: 88 U/L (ref 38–126)
Anion gap: 8 (ref 5–15)
BUN: 14 mg/dL (ref 6–20)
CO2: 25 mmol/L (ref 22–32)
Calcium: 9.2 mg/dL (ref 8.9–10.3)
Chloride: 103 mmol/L (ref 98–111)
Creatinine, Ser: 0.96 mg/dL (ref 0.61–1.24)
GFR, Estimated: >60 mL/min (ref >60)
Glucose, Bld: 102 mg/dL — ABNORMAL HIGH (ref 70–99)
Potassium: 4.2 mmol/L (ref 3.5–5.1)
Sodium: 136 mmol/L (ref 135–145)
Total Bilirubin: 0.6 mg/dL (ref 0.3–1.2)
Total Protein: 7.8 g/dL (ref 6.5–8.1)

## 2021-12-26 NOTE — ED Provider Notes (Signed)
?Gabriel Shelton ?Provider Note ? ? ?CSN: [id number] ?Arrival date & time: 12/26/21  1106 ? ?  ? ?History ? ?Chief Complaint  ?Patient presents with  ? Weakness  ? ? ?Gabriel Shelton is a 41 y.o. male. ? ?HPI ? ?  ? ?41 year old male with history anxiety, depression, nephrolithiasis presents with concern for lightheadedness. ? ?Felt lightheaded, felt heart racing (was walking up and down steps for 30 minutes) ?Lightheadedness for about one hour, thought it would go away but didn't, lasted about 1.5hr LIke a near-syncope ?Denies numbness, weakness, difficulty talking or walking, visual changes or facial droop. ?No nausea, no vomiting, black or bloody stools ?Sunday had diarrhea, thought from over eating, was one episode ?Eating and drinking ok, up until today,had a red bull and breakfast 2 bacon and egg biscuits this AM ?No chest pain or dyspnea ? ?No head trauma ? 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 ?No recent changes ?Runny nose, sneezing, postnasal drip from allergies, not really coughing much ?No chest pain or dyspnea ? ?3 years ago had anxiety attack, this felt different ? ?Smoking, occ etoh, no drugs ?Past Medical History:  ?Diagnosis Date  ? Anxiety   ? Depression   ? GERD (gastroesophageal reflux disease)   ? Kidney stone   ?  ? ?Home Medications ?Prior to Admission medications   ?Medication Sig Start Date End Date Taking? Authorizing Provider  ?buPROPion (WELLBUTRIN XL) 150 MG 24 hr tablet Take 1 tablet (150 mg total) by mouth daily. 03/12/21   Emeterio Reeve, DO  ?   ? ?Allergies    ?Zoloft [sertraline hcl], Zolpidem tartrate, and Lexapro [escitalopram oxalate]   ? ?Review of Systems   ?Review of Systems ? ?Physical Exam ?Updated Vital Signs ?BP (!) 141/100 (BP Location: Left Arm)   Pulse 90   Temp 98.2 ?F (36.8 ?C) (Oral)   Resp 18   Ht 5\' 9"  (1.753 m)   Wt 81.6 kg    SpO2 98%   BMI 26.58 kg/m?  ?Physical Exam ?Vitals and nursing note reviewed.  ?Constitutional:   ?   General: He is not in acute distress. ?   Appearance: Normal appearance. He is well-developed. He is not ill-appearing or diaphoretic.  ?HENT:  ?   Head: Normocephalic and atraumatic.  ?Eyes:  ?   General: No visual field deficit. ?   Extraocular Movements: Extraocular movements intact.  ?   Conjunctiva/sclera: Conjunctivae normal.  ?   Pupils: Pupils are equal, round, and reactive to light.  ?Cardiovascular:  ?   Rate and Rhythm: Normal rate and regular rhythm.  ?   Pulses: Normal pulses.  ?   Heart sounds: Normal heart sounds. No murmur heard. ?  No friction rub. No gallop.  ?Pulmonary:  ?   Effort: Pulmonary effort is normal. No respiratory distress.  ?   Breath sounds: Normal breath sounds. No wheezing or rales.  ?Abdominal:  ?   General: There is no distension.  ?   Palpations: Abdomen is soft.  ?   Tenderness: There is no abdominal tenderness. There is no guarding.  ?Musculoskeletal:     ?   General: No swelling or tenderness.  ?   Cervical back: Normal range of motion.  ?Skin: ?   General: Skin is warm and dry.  ?   Findings: No erythema or rash.  ?Neurological:  ?  General: No focal deficit present.  ?   Mental Status: He is alert and oriented to person, place, and time.  ?   GCS: GCS eye subscore is 4. GCS verbal subscore is 5. GCS motor subscore is 6.  ?   Cranial Nerves: No cranial nerve deficit, dysarthria or facial asymmetry.  ?   Sensory: No sensory deficit.  ?   Motor: No weakness or tremor.  ?   Coordination: Coordination normal. Finger-Nose-Finger Test normal.  ?   Gait: Gait normal.  ? ? ?ED Results / Procedures / Treatments   ?Labs ?(all labs ordered are listed, but only abnormal results are displayed) ?Labs Reviewed  ?COMPREHENSIVE METABOLIC PANEL - Abnormal; Notable for the following components:  ?    Result Value  ? Glucose, Bld 102 (*)   ? ALT 51 (*)   ? All other components within normal  limits  ?CBC WITH DIFFERENTIAL/PLATELET - Abnormal; Notable for the following components:  ? Platelets 415 (*)   ? All other components within normal limits  ? ? ?EKG ?EKG Interpretation ? ?Date/Time:  Wednesday December 26 2021 12:31:32 EDT ?Ventricular Rate:  77 ?PR Interval:  175 ?QRS Duration: 106 ?QT Interval:  362 ?QTC Calculation: 410 ?R Axis:   59 ?Text Interpretation: Sinus rhythm ST elev, probable normal early repol pattern No previous ECGs available Confirmed by Gareth Morgan [phone]) on 12/27/2021 6:48:38 AM ? ?Radiology ?No results found. ? ?Procedures ?Procedures  ? ? ?Medications Ordered in ED ?Medications - No data to display ? ?ED Course/ Medical Decision Making/ A&P ?  ? ?41 year old male with history anxiety, depression, nephrolithiasis presents with concern for lightheadedness. ? ?Differential diagnosis includes anemia, electrolyte abnormality, cardiac abnormality, infection, hypothyroidism, other toxic/metabolic abnormalities.  No focal neurologic concerns on history or exam to suggest CVA, ICH or other central etiology.  Pt hemodynamically stable, afebrile, and without hx of significant infectious symptoms.  CBC showed no sign of anemia.  Electrolytes WNL.  EKG shows early repolarization, no sign of arrhythmia, no chest pain, arm pain, neck pain or dyspnea to suggest PE, ACS. Hemodynamically stable while in ED. Unclear if possible arrhythmia with increased heart rate, although note sinus tachycardia at the time he presented to EMS.  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  Recommend PCP follow up, discussed return precautions. Patient discharged in stable condition with understanding of reasons to return.  ? ? ? ? ? ? ? ? ?Final Clinical Impression(s) / ED Diagnoses ?Final diagnoses:  ?Lightheadedness  ? ? ?Rx / DC Orders ?ED Discharge Orders   ? ? None  ? ?  ? ? ?  ?Gareth Morgan, MD ?12/27/21 [phone] ? ?

## 2021-12-26 NOTE — ED Notes (Signed)
Up to restroom, gait very steady, no assistance required, voices no complaints ?

## 2021-12-26 NOTE — ED Notes (Signed)
BEFAST and VAN negative  ?

## 2021-12-26 NOTE — ED Triage Notes (Signed)
Arrived by EMS due to feeling light headed. ECG performed by EMS and CBG also, VSS ?

## 2021-12-31 ENCOUNTER — Telehealth: Payer: Self-pay | Admitting: General Practice

## 2021-12-31 ENCOUNTER — Encounter: Payer: Self-pay | Admitting: Family Medicine

## 2021-12-31 NOTE — Telephone Encounter (Signed)
Transition Care Management Unsuccessful Follow-up Telephone Call ? ?Date of discharge and from where:  12/26/21 from [facility] ? ?Attempts:  1st Attempt ? ?Reason for unsuccessful TCM follow-up call:  Left voice message ? ?  ?

## 2022-01-02 NOTE — Telephone Encounter (Signed)
Transition Care Management Unsuccessful Follow-up Telephone Call ? ?Date of discharge and from where:  12/26/21 from [facility] ? ?Attempts:  2nd Attempt ? ?Reason for unsuccessful TCM follow-up call:  Left voice message ? ?  ?

## 2022-01-07 ENCOUNTER — Encounter: Payer: Self-pay | Admitting: Family Medicine

## 2022-01-07 ENCOUNTER — Telehealth (INDEPENDENT_AMBULATORY_CARE_PROVIDER_SITE_OTHER): Payer: 59 | Admitting: Family Medicine

## 2022-01-07 VITALS — Ht 69.0 in | Wt 180.0 lb

## 2022-01-07 DIAGNOSIS — R000 Tachycardia, unspecified: Secondary | ICD-10-CM | POA: Diagnosis not present

## 2022-01-07 DIAGNOSIS — I10 Essential (primary) hypertension: Secondary | ICD-10-CM

## 2022-01-07 MED ORDER — AMLODIPINE BESYLATE 5 MG PO TABS: 5.0000 mg | ORAL_TABLET | Freq: Every day | ORAL | 1 refills | Status: DC

## 2022-01-07 MED ORDER — BUSPIRONE HCL 10 MG PO TABS: 10.0000 mg | ORAL_TABLET | Freq: Two times a day (BID) | ORAL | 0 refills | Status: DC

## 2022-01-07 NOTE — Telephone Encounter (Signed)
Transition Care Management Unsuccessful Follow-up Telephone Call ? ?Date of discharge and from where:  12/26/21 from [facility] ? ?Attempts:  3rd Attempt ? ?Reason for unsuccessful TCM follow-up call:  Left voice message ? ?  ?

## 2022-01-07 NOTE — Assessment & Plan Note (Addendum)
Elevated BP readings at home.  Recommend adding amlodipine 5mg  daily with low sodium diet.  He has cut back on caffeine intake.  He would like to proceed with cardiology referral as well for tachycardia.  Orders entered.  F/u in 6-8 weeks IN OFFICE for follow up visit.  ?

## 2022-01-07 NOTE — Progress Notes (Signed)
Patient has been scheduled for 02/21/2022* AMUCK ?

## 2022-01-07 NOTE — Progress Notes (Signed)
?[name] - 41 y.o. male MRN [id number]  Date of birth: 01-20-1981 ? ? ?This visit type was conducted due to national recommendations for restrictions regarding the COVID-19 Pandemic (e.g. social distancing).  This format is felt to be most appropriate for this patient at this time.  All issues noted in this document were discussed and addressed.  No physical exam was performed (except for noted visual exam findings with Video Visits).  I discussed the limitations of evaluation and management by telemedicine and the availability of in person appointments. The patient expressed understanding and agreed to proceed. ? ?I connected withNAME@ on 01/07/22 at 11:30 AM EDT by a video enabled telemedicine application and verified that I am speaking with the correct person using two identifiers. ? ?Present at visit: ?[name], DO ?Malachi Pro ? ? ?Patient Location: Home ?[address] [address] ?[place] [address]  ? ?Provider location:   ?[facility] ? ?No chief complaint on file. ? ? ?HPI ? ?Gabriel Shelton is a 41 y.o. male who presents via audio/video conferencing for a telehealth visit today.  He is following up today for recent ER visit due to  ?Dizziness and fatigue.  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  Felt like he may pass out.  He fortunately had a delivery to EMS that day and stopped into get checked out as well.  He heart rate was elevated and it was recommended that he get checked out at the hospital.  Work up in the hospital was unremarkable.  Recommended that he follow up with PCP and see a cardiologist.  Thought there may have been dehydration contributing.  He would like to see cardiology.  Additionally he has been monitoring blood pressures at home and these have been elevated.  He denies chest pain, headache or vision changes.  He does admit that stress and anxiety have been increased.  Bupropion does help some with this.   ? ? ? ?ROS:  A comprehensive ROS was completed and negative except as noted per HPI ? ?Past Medical History:  ?Diagnosis Date  ? Anxiety   ? Depression   ? GERD (gastroesophageal reflux disease)   ? Kidney stone   ? ? ?Past Surgical History:  ?Procedure Laterality Date  ? INNER EAR SURGERY    ? LITHOTRIPSY    ? ? ?Family History  ?Problem Relation Age of Onset  ? Healthy Mother   ? Heart failure Father   ? Bipolar disorder Neg Hx   ? Depression Neg Hx   ? ? ?Social History  ? ?Socioeconomic History  ? Marital status: Single  ?  Spouse name: Not on file  ? Number of children: Not on file  ? Years of education: Not on file  ? Highest education level: Not on file  ?Occupational History  ? Not on file  ?Tobacco Use  ? Smoking status: Every Day  ?  Packs/day: 1.00  ?  Years: 12.00  ?  Pack years: 12.00  ?  Types: Cigarettes  ? Smokeless tobacco: Never  ?Vaping Use  ? Vaping Use: Former  ? Quit date: 09/02/2018  ?Substance and Sexual Activity  ? Alcohol use: No  ? Drug use: No  ? Sexual activity: Yes  ?Other Topics Concern  ? Not on file  ?Social History Narrative  ? Not on file  ? ?Social Determinants of Health  ? ?Financial Resource Strain: Not  on file  ?Food Insecurity: Not on file  ?Transportation Needs: Not on file  ?Physical Activity: Not on file  ?Stress: Not on file  ?Social Connections: Not on file  ?Intimate Partner Violence: Not on file  ? ? ? ?Current Outpatient Medications:  ?  amLODipine (NORVASC) 5 MG tablet, Take 1 tablet (5 mg total) by mouth daily., Disp: 90 tablet, Rfl: 1 ?  busPIRone (BUSPAR) 10 MG tablet, Take 1 tablet (10 mg total) by mouth 2 (two) times daily., Disp: 180 tablet, Rfl: 0 ?  buPROPion (WELLBUTRIN XL) 150 MG 24 hr tablet, Take 1 tablet (150 mg total) by mouth daily., Disp: 90 tablet, Rfl: 3 ? ?EXAM: ? ?VITALS per patient if applicable: There were no vitals taken for this visit. ? ?GENERAL: alert, oriented, appears well and in no acute distress ? ?HEENT: atraumatic, conjunttiva clear,  no obvious abnormalities on inspection of external nose and ears ? ?NECK: normal movements of the head and neck ? ?LUNGS: on inspection no signs of respiratory distress, breathing rate appears normal, no obvious gross SOB, gasping or wheezing ? ?CV: no obvious cyanosis ? ?MS: moves all visible extremities without noticeable abnormality ? ?PSYCH/NEURO: pleasant and cooperative, no obvious depression or anxiety, speech and thought processing grossly intact ? ?ASSESSMENT AND PLAN: ? ?Discussed the following assessment and plan: ? ?Essential hypertension ?Elevated BP readings at home.  Recommend adding amlodipine 5mg  daily with low sodium diet.  He has cut back on caffeine intake.  He would like to proceed with cardiology referral as well for tachycardia.  Orders entered.  F/u in 6-8 weeks IN OFFICE for follow up visit.  ? ? ?  ?I discussed the assessment and treatment plan with the patient. The patient was provided an opportunity to ask questions and all were answered. The patient agreed with the plan and demonstrated an understanding of the instructions. ?  ?The patient was advised to call back or seek an in-person evaluation if the symptoms worsen or if the condition fails to improve as anticipated. ? ? ? ? , DO  ? ?

## 2022-01-24 ENCOUNTER — Encounter: Payer: Self-pay | Admitting: Family Medicine

## 2022-02-19 ENCOUNTER — Other Ambulatory Visit: Payer: Self-pay | Admitting: Osteopathic Medicine

## 2022-02-21 ENCOUNTER — Ambulatory Visit: Payer: 59 | Admitting: Family Medicine

## 2022-03-18 ENCOUNTER — Other Ambulatory Visit: Payer: Self-pay | Admitting: Family Medicine

## 2022-03-19 MED ORDER — BUSPIRONE HCL 10 MG PO TABS
10.0000 mg | ORAL_TABLET | Freq: Two times a day (BID) | ORAL | 0 refills | Status: DC
Start: 2022-03-19 — End: 2022-10-31

## 2022-05-26 ENCOUNTER — Other Ambulatory Visit: Payer: Self-pay | Admitting: Family Medicine

## 2022-06-03 ENCOUNTER — Other Ambulatory Visit: Payer: Self-pay | Admitting: Family Medicine

## 2022-07-10 ENCOUNTER — Other Ambulatory Visit: Payer: Self-pay | Admitting: Family Medicine

## 2022-07-10 MED ORDER — AMLODIPINE BESYLATE 5 MG PO TABS: 5.0000 mg | ORAL_TABLET | Freq: Every day | ORAL | 0 refills | Status: DC

## 2022-07-10 MED ORDER — BUPROPION HCL ER (XL) 150 MG PO TB24
150.0000 mg | ORAL_TABLET | Freq: Every day | ORAL | 0 refills | Status: DC
Start: 2022-07-10 — End: 2022-08-30

## 2022-07-29 ENCOUNTER — Encounter: Payer: Self-pay | Admitting: Family Medicine

## 2022-08-21 ENCOUNTER — Telehealth: Payer: Federal, State, Local not specified - PPO | Admitting: Family Medicine

## 2022-08-21 ENCOUNTER — Encounter: Payer: Self-pay | Admitting: Family Medicine

## 2022-08-21 VITALS — Ht 69.0 in | Wt 175.0 lb

## 2022-08-21 DIAGNOSIS — R109 Unspecified abdominal pain: Secondary | ICD-10-CM

## 2022-08-21 DIAGNOSIS — K921 Melena: Secondary | ICD-10-CM

## 2022-08-21 NOTE — Assessment & Plan Note (Signed)
Unclear etiology.  Possibly related to inflammatory bowel disease.  CBC, stool culture and C. difficile ordered as well.  Referral placed to gastroenterology in our area to see if this is cheaper for him to have colonoscopy.

## 2022-08-21 NOTE — Progress Notes (Signed)
Symptoms: diarrhea  Hx: rectal bleeding x 1 month

## 2022-08-21 NOTE — Progress Notes (Signed)
Gabriel Shelton - 41 y.o. male MRN [id number]  Date of birth: 02/13/1981   This visit type was conducted due to national recommendations for restrictions regarding the COVID-19 Pandemic (e.g. social distancing).  This format is felt to be most appropriate for this patient at this time.  All issues noted in this document were discussed and addressed.  No physical exam was performed (except for noted visual exam findings with Video Visits).  I discussed the limitations of evaluation and management by telemedicine and the availability of in person appointments. The patient expressed understanding and agreed to proceed.  I connected withNAME@ on 08/21/22 at  4:10 PM EST by a video enabled telemedicine application and verified that I am speaking with the correct person using two identifiers.  Present at visit: [name], DO Malachi Pro   Patient Location: Home [address] [place] [address]   Provider location:   [facility]  Chief Complaint  Patient presents with   GI Problem    HPI  Gabriel Shelton is a 41 y.o. male who presents via audio/video conferencing for a telehealth visit today.  Patient is complaining of blood in his stool.  He has had bleeding with bowel movements and abdominal pain a little over the past month.  He did have evaluation by physician where he is living now in [place].  This included CRP, celiac testing, stool calprotectin.  Labs are unremarkable.  Additionally he did have a CT of the abdomen and pelvis which she reports was unremarkable.  He was referred to GI for colonoscopy as well as EGD.  He did not go through with this due to cost.  He is interested in having a referral to a provider back in [place] to see if this is any cheaper.  He does report that he continues to have the same symptoms.  Abdominal pain is severe enough to keep him awake at night.  He does have increased abdominal pain after eating.  Denies nausea or vomiting.  No fever or chills.  Denies weight  loss.   ROS:  A comprehensive ROS was completed and negative except as noted per HPI  Past Medical History:  Diagnosis Date   Anxiety    Depression    GERD (gastroesophageal reflux disease)    Kidney stone     Past Surgical History:  Procedure Laterality Date   INNER EAR SURGERY     LITHOTRIPSY      Family History  Problem Relation Age of Onset   Healthy Mother    Heart failure Father    Bipolar disorder Neg Hx    Depression Neg Hx     Social History   Socioeconomic History   Marital status: Single    Spouse name: Not on file   Number of children: Not on file   Years of education: Not on file   Highest education level: Not on file  Occupational History   Not on file  Tobacco Use   Smoking status: Every Day    Packs/day: 1.00    Years: 12.00    Total pack years: 12.00    Types: Cigarettes   Smokeless tobacco: Never  Vaping Use   Vaping Use: Former   Quit date: 09/02/2018  Substance and Sexual Activity   Alcohol use: No   Drug use: No   Sexual activity: Yes  Other Topics Concern   Not on file  Social History Narrative   Not on file   Social  Determinants of Health   Financial Resource Strain: Not on file  Food Insecurity: Not on file  Transportation Needs: Not on file  Physical Activity: Not on file  Stress: Not on file  Social Connections: Not on file  Intimate Partner Violence: Not on file     Current Outpatient Medications:    amLODipine (NORVASC) 5 MG tablet, Take 1 tablet (5 mg total) by mouth daily., Disp: 90 tablet, Rfl: 0   buPROPion (WELLBUTRIN XL) 150 MG 24 hr tablet, Take 1 tablet (150 mg total) by mouth daily., Disp: 90 tablet, Rfl: 0   busPIRone (BUSPAR) 10 MG tablet, Take 1 tablet (10 mg total) by mouth 2 (two) times daily. Needs appointment., Disp: 180 tablet, Rfl: 0  EXAM:  VITALS per patient if applicable: Ht 5\' 9"  (1.753 m)   Wt 175 lb (79.4 kg)   BMI 25.84 kg/m   GENERAL: alert, oriented, appears well and in no acute  distress  HEENT: atraumatic, conjunttiva clear, no obvious abnormalities on inspection of external nose and ears  NECK: normal movements of the head and neck  LUNGS: on inspection no signs of respiratory distress, breathing rate appears normal, no obvious gross SOB, gasping or wheezing  CV: no obvious cyanosis  MS: moves all visible extremities without noticeable abnormality  PSYCH/NEURO: pleasant and cooperative, no obvious depression or anxiety, speech and thought processing grossly intact  ASSESSMENT AND PLAN:  Discussed the following assessment and plan:  Blood in stool Unclear etiology.  Possibly related to inflammatory bowel disease.  CBC, stool culture and C. difficile ordered as well.  Referral placed to gastroenterology in our area to see if this is cheaper for him to have colonoscopy.     I discussed the assessment and treatment plan with the patient. The patient was provided an opportunity to ask questions and all were answered. The patient agreed with the plan and demonstrated an understanding of the instructions.   The patient was advised to call back or seek an in-person evaluation if the symptoms worsen or if the condition fails to improve as anticipated.    , DO

## 2022-08-23 ENCOUNTER — Encounter: Payer: Self-pay | Admitting: Family Medicine

## 2022-08-29 ENCOUNTER — Encounter: Payer: Self-pay | Admitting: Family Medicine

## 2022-08-30 ENCOUNTER — Other Ambulatory Visit: Payer: Self-pay | Admitting: Family Medicine

## 2022-08-30 MED ORDER — BUPROPION HCL ER (XL) 150 MG PO TB24
150.0000 mg | ORAL_TABLET | Freq: Every day | ORAL | 0 refills | Status: DC
Start: 2022-08-30 — End: 2022-12-18

## 2022-08-30 MED ORDER — AMLODIPINE BESYLATE 5 MG PO TABS: 5.0000 mg | ORAL_TABLET | Freq: Every day | ORAL | 0 refills | Status: DC

## 2022-09-17 ENCOUNTER — Telehealth: Payer: Self-pay | Admitting: Internal Medicine

## 2022-09-17 NOTE — Telephone Encounter (Signed)
Good Morning Dr Rhea Belton  Supervising MD 09/17/22  We have received a referral for patient to be seen for blood in stool and also abdominal pain.  Patient is requesting a transfer due to not being satisfied with their current provider.  I am sending records for review. Please review and advise on scheduling.  Thank you

## 2022-10-24 ENCOUNTER — Encounter: Payer: Self-pay | Admitting: Gastroenterology

## 2022-10-24 ENCOUNTER — Ambulatory Visit (INDEPENDENT_AMBULATORY_CARE_PROVIDER_SITE_OTHER): Payer: Federal, State, Local not specified - PPO | Admitting: Gastroenterology

## 2022-10-24 VITALS — BP 150/94 | HR 84 | Ht 69.0 in | Wt 196.5 lb

## 2022-10-24 DIAGNOSIS — R933 Abnormal findings on diagnostic imaging of other parts of digestive tract: Secondary | ICD-10-CM | POA: Diagnosis not present

## 2022-10-24 DIAGNOSIS — K921 Melena: Secondary | ICD-10-CM

## 2022-10-24 NOTE — Progress Notes (Unsigned)
HPI : Gabriel Shelton is a very pleasant 42 year old male with a history of hypertension who is referred to [place] by Dr. Luetta Nutting for further evaluation of hematochezia and abnormal CT scan.  The patient states that he first started seeing blood in his stool about 4 months ago while he was in [place].  Initially the blood was painless and he did not have any other symptoms.  He just noticed the presence of bright red blood in the toilet.  A few days later he developed abdominal pain, followed by nausea and vomiting.  He continued to have abdominal pain and frequent nausea and vomiting for well over a month.  His stools increased in frequency and became more loose in consistency. Pain started a few days later.  Initially upper abdominal pain, then migrated lower.  Was vomiting every time he ate for about a month.  Improved with moving out of [place]  Stools vary in consistency.  Liquid solid.  Sometimes sees blood still. Usually 2 bowel movements per day.  No nocturnal Bms.  Some urgency.  Pain currently mild.  3-4/10.  No nausea.  Normal appetite.  Went to ED. CT showed possible colitis.  Came back to [place] 2 weeks ago  EGD about 10 years ago because of 'bad indigestion' [facility]   Rare GERD symptoms.  No fam hx GI malignancy  No NSAIDs  Taking Nexium once daily for the past month.  Took antibiotics for an infected tooth 2 months ago.  Given antibiotics at ED.   Was scheduled to have a colonoscopy in [place], but was going to cost $5000; he has the bowel prep already   CT abdomen/pelvis Jul 17, 2022 ([place]) Impression:  Mild diffuse colon wall thickening which could be due to mild colitis     Past Medical History:  Diagnosis Date   Anxiety    Depression    GERD (gastroesophageal reflux disease)    High blood pressure    Kidney stone      Past Surgical History:  Procedure Laterality Date   INNER EAR SURGERY     LITHOTRIPSY     Family  History  Problem Relation Age of Onset   Healthy Mother    Heart failure Father    Bipolar disorder Neg Hx    Depression Neg Hx    Social History   Tobacco Use   Smoking status: Former    Packs/day: 1.00    Years: 12.00    Total pack years: 12.00    Types: Cigarettes   Smokeless tobacco: Never  Vaping Use   Vaping Use: Every day   Last attempt to quit: 09/02/2018  Substance Use Topics   Alcohol use: Yes    Comment: occ   Drug use: No   Current Outpatient Medications  Medication Sig Dispense Refill   amLODipine (NORVASC) 5 MG tablet Take 1 tablet (5 mg total) by mouth daily. 90 tablet 0   buPROPion (WELLBUTRIN XL) 150 MG 24 hr tablet Take 1 tablet (150 mg total) by mouth daily. 90 tablet 0   busPIRone (BUSPAR) 10 MG tablet Take 1 tablet (10 mg total) by mouth 2 (two) times daily. Needs appointment. 180 tablet 0   esomeprazole (NEXIUM) 40 MG capsule Take 40 mg by mouth 3 (three) times daily.     No current facility-administered medications for this visit.   Allergies  Allergen Reactions   Zoloft [Sertraline Hcl] Other (See Comments)  suicidal    Zolpidem Tartrate Other (See Comments)    hallucinations    Lexapro [Escitalopram Oxalate] Other (See Comments)    Irritability      Review of Systems: All systems reviewed and negative except where noted in HPI.    No results found.  Physical Exam: BP (!) 150/96 (BP Location: Left Arm, Patient Position: Sitting, Cuff Size: Normal)   Pulse 91   Ht 5\' 9"  (1.753 m)   Wt 196 lb 8 oz (89.1 kg)   SpO2 98%   BMI 29.02 kg/m  Constitutional: Pleasant,well-developed, Caucasian male in no acute distress. HEENT: Normocephalic and atraumatic. Conjunctivae are normal. No scleral icterus. Neck supple.  Cardiovascular: Normal rate, regular rhythm.  Pulmonary/chest: Effort normal and breath sounds normal. No wheezing, rales or rhonchi. Abdominal: Soft, nondistended, mild, nonreproducible abdominal pain around umbilicus, no  rigidity or guarding Bowel sounds active throughout. There are no masses palpable. No hepatomegaly. Extremities: no edema Lymphadenopathy: No cervical adenopathy noted. Neurological: Alert and oriented to person place and time. Skin: Skin is warm and dry. No rashes noted. Psychiatric: Normal mood and affect. Behavior is normal.  CBC    Component Value Date/Time   WBC 7.2 12/26/2021 1129   RBC 4.94 12/26/2021 1129   HGB 16.0 12/26/2021 1129   HCT 45.0 12/26/2021 1129   PLT 415 (H) 12/26/2021 1129   MCV 91.1 12/26/2021 1129   MCH 32.4 12/26/2021 1129   MCHC 35.6 12/26/2021 1129   RDW 12.5 12/26/2021 1129   LYMPHSABS 1.9 12/26/2021 1129   MONOABS 0.6 12/26/2021 1129   EOSABS 0.1 12/26/2021 1129   BASOSABS 0.0 12/26/2021 1129    CMP     Component Value Date/Time   NA 136 12/26/2021 1129   K 4.2 12/26/2021 1129   CL 103 12/26/2021 1129   CO2 25 12/26/2021 1129   GLUCOSE 102 (H) 12/26/2021 1129   BUN 14 12/26/2021 1129   CREATININE 0.96 12/26/2021 1129   CREATININE 1.16 12/27/2020 0000   CALCIUM 9.2 12/26/2021 1129   PROT 7.8 12/26/2021 1129   ALBUMIN 4.4 12/26/2021 1129   AST 33 12/26/2021 1129   ALT 51 (H) 12/26/2021 1129   ALKPHOS 88 12/26/2021 1129   BILITOT 0.6 12/26/2021 1129   GFRNONAA >60 12/26/2021 1129   GFRNONAA 79 12/27/2020 0000   GFRAA 92 12/27/2020 0000     ASSESSMENT AND PLAN:   Hematochezia - Colonoscopy  Abnormal imaging (CT) - Colonoscopy  Luetta Nutting, DO

## 2022-10-24 NOTE — Patient Instructions (Addendum)
_______________________________________________________  If your blood pressure at your visit was 140/90 or greater, please contact your primary care physician to follow up on this.  _______________________________________________________  If you are age 41 or older, your body mass index should be between 23-30. Your Body mass index is 29.02 kg/m. If this is out of the aforementioned range listed, please consider follow up with your Primary Care Provider.  If you are age 87 or younger, your body mass index should be between 19-25. Your Body mass index is 29.02 kg/m. If this is out of the aformentioned range listed, please consider follow up with your Primary Care Provider.   You have been scheduled for a colonoscopy. Please follow written instructions given to you at your visit today.  Please pick up your prep supplies at the pharmacy within the next 1-3 days. If you use inhalers (even only as needed), please bring them with you on the day of your procedure.    The [facility] providers would like to encourage you to use [facility] to communicate with providers for non-urgent requests or questions.  Due to long hold times on the telephone, sending your provider a message by [facility] may be a faster and more efficient way to get a response.  Please allow 48 business hours for a response.  Please remember that this is for non-urgent requests.   It was a pleasure to see you today!  Thank you for trusting me with your gastrointestinal care!    Scott E.Candis Schatz, MD

## 2022-10-28 ENCOUNTER — Other Ambulatory Visit: Payer: Self-pay | Admitting: Family Medicine

## 2022-10-31 NOTE — Telephone Encounter (Signed)
Called patient, left voicemail to call office to schedule appointment, thanks.

## 2022-11-19 ENCOUNTER — Ambulatory Visit: Payer: Federal, State, Local not specified - PPO | Admitting: Family Medicine

## 2022-11-19 ENCOUNTER — Encounter: Payer: Self-pay | Admitting: Family Medicine

## 2022-11-19 VITALS — BP 136/83 | HR 77 | Ht 69.0 in | Wt 202.0 lb

## 2022-11-19 DIAGNOSIS — R809 Proteinuria, unspecified: Secondary | ICD-10-CM | POA: Diagnosis not present

## 2022-11-19 DIAGNOSIS — Z23 Encounter for immunization: Secondary | ICD-10-CM

## 2022-11-19 DIAGNOSIS — I10 Essential (primary) hypertension: Secondary | ICD-10-CM

## 2022-11-19 DIAGNOSIS — F411 Generalized anxiety disorder: Secondary | ICD-10-CM | POA: Diagnosis not present

## 2022-11-19 NOTE — Assessment & Plan Note (Signed)
Doing well with combination of bupropion and BuSpar.  He will continue these at current strength.

## 2022-11-19 NOTE — Progress Notes (Signed)
Gabriel Shelton - 42 y.o. male MRN [id number]  Date of birth: [birth year]  Subjective Chief Complaint  Patient presents with   Hypertension   Medical Clearance    HPI Gabriel Shelton is a 42 year old male here today for follow-up visit.  He was referred back to our clinic from occupational medicine due to trace proteinuria and hematuria on his urinalysis.  He does have history of hypertension that has been well-controlled.  Denies any urinary symptoms including frequency, urgency or hesitancy.  He has not had any flank pain.  He has not had any gross blood in his urine.  He does report that he had urine collected right after he had gotten off work that day.  Additionally his depression and anxiety remain well-controlled with combination of bupropion and buspirone.  No side effects related to these medications including sedation.  ROS:  A comprehensive ROS was completed and negative except as noted per HPI  Allergies  Allergen Reactions   Zoloft [Sertraline Hcl] Other (See Comments)    suicidal    Zolpidem Tartrate Other (See Comments)    hallucinations    Lexapro [Escitalopram Oxalate] Other (See Comments)    Irritability     Past Medical History:  Diagnosis Date   Anxiety    Depression    GERD (gastroesophageal reflux disease)    High blood pressure    Kidney stone     Past Surgical History:  Procedure Laterality Date   INNER EAR SURGERY     LITHOTRIPSY      Social History   Socioeconomic History   Marital status: Single    Spouse name: Not on file   Number of children: Not on file   Years of education: Not on file   Highest education level: Not on file  Occupational History   Not on file  Tobacco Use   Smoking status: Former    Packs/day: 1.00    Years: 12.00    Total pack years: 12.00    Types: Cigarettes   Smokeless tobacco: Never  Vaping Use   Vaping Use: Every day   Last attempt to quit: 09/02/2018  Substance and Sexual Activity   Alcohol use: Yes     Comment: occ   Drug use: No   Sexual activity: Yes  Other Topics Concern   Not on file  Social History Narrative   Not on file   Social Determinants of Health   Financial Resource Strain: Not on file  Food Insecurity: Not on file  Transportation Needs: Not on file  Physical Activity: Not on file  Stress: Not on file  Social Connections: Not on file    Family History  Problem Relation Age of Onset   Healthy Mother    Heart failure Father    Bipolar disorder Neg Hx    Depression Neg Hx     Health Maintenance  Topic Date Due   DTaP/Tdap/Td (3 - Td or Tdap) 06/20/2030   INFLUENZA VACCINE  Completed   COVID-19 Vaccine  Completed   Hepatitis C Screening  Completed   HIV Screening  Completed   HPV VACCINES  Aged Out     ----------------------------------------------------------------------------------------------------------------------------------------------------------------------------------------------------------------- Physical Exam BP 136/83 (BP Location: Left Arm, Patient Position: Sitting, Cuff Size: Normal)   Pulse 77   Ht '5\' 9"'$  (1.753 m)   Wt 202 lb (91.6 kg)   SpO2 99%   BMI 29.83 kg/m   Physical Exam Constitutional:      Appearance: Normal appearance.  HENT:  Head: Normocephalic and atraumatic.  Eyes:     General: No scleral icterus. Cardiovascular:     Rate and Rhythm: Normal rate and regular rhythm.  Pulmonary:     Effort: Pulmonary effort is normal.     Breath sounds: Normal breath sounds.  Musculoskeletal:     Cervical back: Neck supple.  Neurological:     Mental Status: He is alert.  Psychiatric:        Mood and Affect: Mood normal.        Behavior: Behavior normal.     ------------------------------------------------------------------------------------------------------------------------------------------------------------------------------------------------------------------- Assessment and Plan  Essential hypertension Blood  pressure mains well-controlled.  Recommend continuation of amlodipine at current strength.  Urinalysis today does not show any hematuria or proteinuria.  Encouraged to stay well-hydrated.  Generalized anxiety disorder Doing well with combination of bupropion and BuSpar.  He will continue these at current strength.   No orders of the defined types were placed in this encounter.   Return in about 6 months (around 05/20/2023) for HTN/GAD.    This visit occurred during the SARS-CoV-2 public health emergency.  Safety protocols were in place, including screening questions prior to the visit, additional usage of staff PPE, and extensive cleaning of exam room while observing appropriate contact time as indicated for disinfecting solutions.

## 2022-11-19 NOTE — Assessment & Plan Note (Signed)
Blood pressure mains well-controlled.  Recommend continuation of amlodipine at current strength.  Urinalysis today does not show any hematuria or proteinuria.  Encouraged to stay well-hydrated.

## 2022-12-06 ENCOUNTER — Ambulatory Visit (AMBULATORY_SURGERY_CENTER): Payer: Self-pay | Admitting: Gastroenterology

## 2022-12-06 ENCOUNTER — Encounter: Payer: Self-pay | Admitting: Gastroenterology

## 2022-12-06 VITALS — BP 131/86 | HR 78 | Temp 98.9°F | Resp 12 | Ht 69.0 in | Wt 196.0 lb

## 2022-12-06 DIAGNOSIS — K2950 Unspecified chronic gastritis without bleeding: Secondary | ICD-10-CM

## 2022-12-06 DIAGNOSIS — D124 Benign neoplasm of descending colon: Secondary | ICD-10-CM

## 2022-12-06 DIAGNOSIS — K921 Melena: Secondary | ICD-10-CM

## 2022-12-06 MED ORDER — SODIUM CHLORIDE 0.9 % IV SOLN: 500.0000 mL | INTRAVENOUS | Status: AC

## 2022-12-06 NOTE — Progress Notes (Signed)
Called to room to assist during endoscopic procedure.  Patient ID and intended procedure confirmed with present staff. Received instructions for my participation in the procedure from the performing physician.  

## 2022-12-06 NOTE — Progress Notes (Signed)
[facility] History and Physical   Primary Care Physician:  Luetta Nutting, DO   Reason for Procedure:   Hematochezia, abnormal CT  Plan:    Colonoscopy     HPI: Gabriel Shelton is a 42 y.o. male undergoing colonoscopy to evaluate hematochezia and abnormal CT scan.  He had been having chronic bloody diarrhea and had a CT scan which showed thickening of the colon.  The diarrhea has resolved but he still sees blood in the stool.  He has no family history of colon cancer.   Past Medical History:  Diagnosis Date   Anxiety    Depression    GERD (gastroesophageal reflux disease)    High blood pressure    Kidney stone     Past Surgical History:  Procedure Laterality Date   INNER EAR SURGERY     LITHOTRIPSY      Prior to Admission medications   Medication Sig Start Date End Date Taking? Authorizing Provider  amLODipine (NORVASC) 5 MG tablet Take 1 tablet (5 mg total) by mouth daily. 08/30/22  Yes Luetta Nutting, DO  buPROPion (WELLBUTRIN XL) 150 MG 24 hr tablet Take 1 tablet (150 mg total) by mouth daily. 08/30/22  Yes Luetta Nutting, DO  busPIRone (BUSPAR) 10 MG tablet Take 1 tablet by mouth twice daily. **Please schedule an appointment for further refills.** 10/31/22  Yes Luetta Nutting, DO  esomeprazole (NEXIUM) 40 MG capsule Take 40 mg by mouth 3 (three) times daily. 08/28/22  Yes [provider]    Current Outpatient Medications  Medication Sig Dispense Refill   amLODipine (NORVASC) 5 MG tablet Take 1 tablet (5 mg total) by mouth daily. 90 tablet 0   buPROPion (WELLBUTRIN XL) 150 MG 24 hr tablet Take 1 tablet (150 mg total) by mouth daily. 90 tablet 0   busPIRone (BUSPAR) 10 MG tablet Take 1 tablet by mouth twice daily. **Please schedule an appointment for further refills.** 180 tablet 0   esomeprazole (NEXIUM) 40 MG capsule Take 40 mg by mouth 3 (three) times daily.     Current Facility-Administered Medications  Medication Dose Route Frequency Provider  Last Rate Last Admin   0.9 %  sodium chloride infusion  500 mL Intravenous Continuous Daryel November, MD        Allergies as of 12/06/2022 - Review Complete 12/06/2022  Allergen Reaction Noted   Zoloft [sertraline hcl] Other (See Comments) 09/02/2019   Zolpidem tartrate Other (See Comments) 03/03/2014   Lexapro [escitalopram oxalate] Other (See Comments) 09/02/2019    Family History  Problem Relation Age of Onset   Healthy Mother    Heart failure Father    Bipolar disorder Neg Hx    Depression Neg Hx     Social History   Socioeconomic History   Marital status: Single    Spouse name: Not on file   Number of children: Not on file   Years of education: Not on file   Highest education level: Not on file  Occupational History   Not on file  Tobacco Use   Smoking status: Former    Packs/day: 1.00    Years: 12.00    Additional pack years: 0.00    Total pack years: 12.00    Types: Cigarettes   Smokeless tobacco: Never  Vaping Use   Vaping Use: Every day   Last attempt to quit: 09/02/2018  Substance and Sexual Activity   Alcohol use: Yes    Comment: occ   Drug use: No  Sexual activity: Yes  Other Topics Concern   Not on file  Social History Narrative   Not on file   Social Determinants of Health   Financial Resource Strain: Not on file  Food Insecurity: Not on file  Transportation Needs: Not on file  Physical Activity: Not on file  Stress: Not on file  Social Connections: Not on file  Intimate Partner Violence: Not on file    Review of Systems:  All other review of systems negative except as mentioned in the HPI.  Physical Exam: Vital signs BP (!) 152/98   Pulse 96   Temp 98.9 F (37.2 C)   Resp 18   Ht 5\' 9"  (1.753 m)   Wt 196 lb (88.9 kg)   SpO2 98%   BMI 28.94 kg/m   General:   Alert,  Well-developed, well-nourished, pleasant and cooperative in NAD Airway:  Mallampati 1 Lungs:  Clear throughout to auscultation.   Heart:  Regular rate  and rhythm; no murmurs, clicks, rubs,  or gallops. Abdomen:  Soft, nontender and nondistended. Normal bowel sounds.   Neuro/Psych:  Normal mood and affect. A and O x 3   Holy [place] E. Candis Schatz, MD [facility]

## 2022-12-06 NOTE — Progress Notes (Signed)
Report to PACU, RN, vss, BBS= Clear.  

## 2022-12-06 NOTE — Op Note (Signed)
[facility] Patient Name: Gabriel Shelton Procedure Date: 12/06/2022 3:09 PM MRN: [id number] Endoscopist: Nicki Reaper E. Candis Schatz , MD, [id number] Age: 42 Referring MD:  Date of Birth: [birth year] Gender: Male Account #: [id number] Procedure:                Colonoscopy Indications:              Hematochezia, Abnormal CT of the GI tract Medicines:                Monitored Anesthesia Care Procedure:                Pre-Anesthesia Assessment:                           - Prior to the procedure, a History and Physical                            was performed, and patient medications and                            allergies were reviewed. The patient's tolerance of                            previous anesthesia was also reviewed. The risks                            and benefits of the procedure and the sedation                            options and risks were discussed with the patient.                            All questions were answered, and informed consent                            was obtained. Prior Anticoagulants: The patient has                            taken no anticoagulant or antiplatelet agents. ASA                            Grade Assessment: II - A patient with mild systemic                            disease. After reviewing the risks and benefits,                            the patient was deemed in satisfactory condition to                            undergo the procedure.                           After obtaining informed consent, the colonoscope  was passed under direct vision. Throughout the                            procedure, the patient's blood pressure, pulse, and                            oxygen saturations were monitored continuously. The                            Olympus [id number] SN [id number] was introduced through                            the anus and advanced to the the terminal ileum,                            with  identification of the appendiceal orifice and                            IC valve. The colonoscopy was performed without                            difficulty. The patient tolerated the procedure                            well. The quality of the bowel preparation was                            adequate. The terminal ileum, ileocecal valve,                            appendiceal orifice, and rectum were photographed.                            The bowel preparation used was SUPREP via split                            dose instruction. Scope In: 3:27:45 PM Scope Out: 3:41:44 PM Scope Withdrawal Time: 0 hours 11 minutes 51 seconds  Total Procedure Duration: 0 hours 13 minutes 59 seconds  Findings:                 The perianal and digital rectal examinations were                            normal. Pertinent negatives include normal                            sphincter tone and no palpable rectal lesions.                           A 4 mm polyp was found in the descending colon. The                            polyp was sessile. The polyp was removed  with a                            cold snare. Resection and retrieval were complete.                            Estimated blood loss was minimal. Estimated blood                            loss was minimal.                           The exam was otherwise normal throughout the                            examined colon.                           The terminal ileum appeared normal.                           Non-bleeding internal hemorrhoids were found during                            retroflexion. The hemorrhoids were Grade I                            (internal hemorrhoids that do not prolapse).                           No additional abnormalities were found on                            retroflexion. Complications:            No immediate complications. Estimated Blood Loss:     Estimated blood loss was minimal. Impression:               - One  4 mm polyp in the descending colon, removed                            with a cold snare. Resected and retrieved.                           - The examined portion of the ileum was normal.                           - Non-bleeding internal hemorrhoids. This is the                            source of the patient's hematochezia.                           - The colon wall thickening on CT was most likely  from self-limited (infectious colitis). Recommendation:           - Patient has a contact number available for                            emergencies. The signs and symptoms of potential                            delayed complications were discussed with the                            patient. Return to normal activities tomorrow.                            Written discharge instructions were provided to the                            patient.                           - Resume previous diet.                           - Continue present medications.                           - Await pathology results.                           - Repeat colonoscopy (date not yet determined) for                            surveillance based on pathology results. [name] E. Candis Schatz, MD 12/06/2022 3:48:37 PM This report has been signed electronically.

## 2022-12-06 NOTE — Patient Instructions (Addendum)
Resume previous diet. Continue present medications. Await pathology results. Repeat colonoscopy (date not yet determined) for surveillance based on pathology results.  Please read over handouts about polyps and hemorrhoids                           YOU HAD AN ENDOSCOPIC PROCEDURE TODAY AT [facility]:   Refer to the procedure report that was given to you for any specific questions about what was found during the examination.  If the procedure report does not answer your questions, please call your gastroenterologist to clarify.  If you requested that your care partner not be given the details of your procedure findings, then the procedure report has been included in a sealed envelope for you to review at your convenience later.  YOU SHOULD EXPECT: Some feelings of bloating in the abdomen. Passage of more gas than usual.  Walking can help get rid of the air that was put into your GI tract during the procedure and reduce the bloating. If you had a lower endoscopy (such as a colonoscopy or flexible sigmoidoscopy) you may notice spotting of blood in your stool or on the toilet paper. If you underwent a bowel prep for your procedure, you may not have a normal bowel movement for a few days.  Please Note:  You might notice some irritation and congestion in your nose or some drainage.  This is from the oxygen used during your procedure.  There is no need for concern and it should clear up in a day or so.  SYMPTOMS TO REPORT IMMEDIATELY:  Following lower endoscopy (colonoscopy or flexible sigmoidoscopy):  Excessive amounts of blood in the stool  Significant tenderness or worsening of abdominal pains  Swelling of the abdomen that is new, acute  Fever of 100F or higher  For urgent or emergent issues, a gastroenterologist can be reached at any hour by calling [phone]. Do not use MyChart messaging for urgent concerns.    DIET:  We do recommend a small meal at first, but then you  may proceed to your regular diet.  Drink plenty of fluids but you should avoid alcoholic beverages for 24 hours.  ACTIVITY:  You should plan to take it easy for the rest of today and you should NOT DRIVE or use heavy machinery until tomorrow (because of the sedation medicines used during the test).    FOLLOW UP: Our staff will call the number listed on your records the next business day following your procedure.  We will call around 7:15- 8:00 am to check on you and address any questions or concerns that you may have regarding the information given to you following your procedure. If we do not reach you, we will leave a message.     If any biopsies were taken you will be contacted by phone or by letter within the next 1-3 weeks.  Please call us at [phone] if you have not heard about the biopsies in 3 weeks.    SIGNATURES/CONFIDENTIALITY: You and/or your care partner have signed paperwork which will be entered into your electronic medical record.  These signatures attest to the fact that that the information above on your After Visit Summary has been reviewed and is understood.  Full responsibility of the confidentiality of this discharge information lies with you and/or your care-partner.

## 2022-12-09 ENCOUNTER — Telehealth: Payer: Self-pay | Admitting: *Deleted

## 2022-12-09 NOTE — Telephone Encounter (Signed)
  Follow up Call-     12/06/2022    2:41 PM  Call back number  Post procedure Call Back phone  # [phone]  Permission to leave phone message Yes    No. Patient questions:  Do you have a fever, pain , or abdominal swelling? No. Pain Score  0 *  Have you tolerated food without any problems? Yes.    Have you been able to return to your normal activities? Yes.    Do you have any questions about your discharge instructions: Diet   No. Medications  No. Follow up visit  No.  Do you have questions or concerns about your Care? No.  Actions: * If pain score is 4 or above: No action needed, pain <4.

## 2022-12-15 NOTE — Progress Notes (Signed)
Momin,  The polyp which I removed during your recent procedure was proven to be completely benign but is considered a "pre-cancerous" polyp that MAY have grown into cancer if it had not been removed.  Studies shows that at least 20% of women over age 41 and 30% of men over age 55 have pre-cancerous polyps.  Based on current nationally recognized surveillance guidelines, I recommend that you have a repeat colonoscopy in 7 years.

## 2022-12-16 ENCOUNTER — Encounter: Payer: Self-pay | Admitting: Gastroenterology

## 2022-12-17 ENCOUNTER — Other Ambulatory Visit: Payer: Self-pay | Admitting: Family Medicine

## 2023-03-02 ENCOUNTER — Other Ambulatory Visit: Payer: Self-pay | Admitting: Family Medicine

## 2023-03-02 DIAGNOSIS — I10 Essential (primary) hypertension: Secondary | ICD-10-CM

## 2023-03-05 MED ORDER — BUPROPION HCL ER (XL) 150 MG PO TB24: 150.0000 mg | ORAL_TABLET | Freq: Every day | ORAL | 0 refills | Status: DC

## 2023-04-01 ENCOUNTER — Other Ambulatory Visit: Payer: Self-pay | Admitting: Family Medicine

## 2023-04-01 DIAGNOSIS — I10 Essential (primary) hypertension: Secondary | ICD-10-CM

## 2023-05-17 ENCOUNTER — Other Ambulatory Visit: Payer: Self-pay | Admitting: Family Medicine

## 2023-05-17 DIAGNOSIS — I10 Essential (primary) hypertension: Secondary | ICD-10-CM

## 2023-05-20 ENCOUNTER — Ambulatory Visit: Payer: Federal, State, Local not specified - PPO | Admitting: Family Medicine

## 2023-05-20 ENCOUNTER — Telehealth (INDEPENDENT_AMBULATORY_CARE_PROVIDER_SITE_OTHER): Payer: BLUE CROSS/BLUE SHIELD | Admitting: Family Medicine

## 2023-05-20 ENCOUNTER — Encounter: Payer: Self-pay | Admitting: Family Medicine

## 2023-05-20 VITALS — BP 250/180 | HR 90 | Ht 69.0 in | Wt 210.0 lb

## 2023-05-20 DIAGNOSIS — F411 Generalized anxiety disorder: Secondary | ICD-10-CM | POA: Diagnosis not present

## 2023-05-20 DIAGNOSIS — I10 Essential (primary) hypertension: Secondary | ICD-10-CM | POA: Diagnosis not present

## 2023-05-20 MED ORDER — BUPROPION HCL ER (XL) 150 MG PO TB24: 150.0000 mg | ORAL_TABLET | Freq: Every day | ORAL | 0 refills | Status: DC

## 2023-05-20 MED ORDER — AMLODIPINE BESYLATE 5 MG PO TABS
5.0000 mg | ORAL_TABLET | Freq: Every day | ORAL | 0 refills | Status: DC
Start: 2023-05-20 — End: 2023-05-27

## 2023-05-20 MED ORDER — BUSPIRONE HCL 10 MG PO TABS: ORAL_TABLET | ORAL | 0 refills | Status: AC

## 2023-05-20 NOTE — Progress Notes (Signed)
Pt states he thinks the home blood pressure machine may be broken. He took BP x 3 and all readings were high. Denies dizziness, nausea or any other side effects.  Requested patient stop by the office for BP check via Nurse Visit. He will consider.

## 2023-05-20 NOTE — Assessment & Plan Note (Signed)
BP significantly elevated on home cuff.  He will pick up a new cuff and let me know readings over the next 48 hours.  Offered nurse visit to check in office but he declines at this time. Continue amlodipine at current strength.

## 2023-05-20 NOTE — Progress Notes (Signed)
Gabriel Shelton - 42 y.o. male MRN [id number]  Date of birth: 06/24/1981   This visit type was conducted due to national recommendations for restrictions regarding the COVID-19 Pandemic (e.g. social distancing).  This format is felt to be most appropriate for this patient at this time.  All issues noted in this document were discussed and addressed.  No physical exam was performed (except for noted visual exam findings with Video Visits).  I discussed the limitations of evaluation and management by telemedicine and the availability of in person appointments. The patient expressed understanding and agreed to proceed.  I connected withNAME@ on 05/20/23 at  9:30 AM EDT by a video enabled telemedicine application and verified that I am speaking with the correct person using two identifiers.  Present at visit: Gabriel Coombe, DO Malachi Pro   Patient Location: Home [address] [place] [address]   Provider location:   [facility]  Chief Complaint  Patient presents with   Hypertension    HPI  Gabriel Shelton is a 42 y.o. male who presents via audio/video conferencing for a telehealth visit today.  Following up on HTN.  Continues on amlodipine 5mg  daily.  Needs refill.  He checked BP with home BP cuff with high reading.  He thinks his cuff is broken. He denies symptoms related to HTN including chest pain, shortness of breath,  palpitations, headache or vision changes.    Anxiety is stable with bupropion.  Rare use of buspar.     ROS:  A comprehensive ROS was completed and negative except as noted per HPI  Past Medical History:  Diagnosis Date   Anxiety    Depression    GERD (gastroesophageal reflux disease)    High blood pressure    Kidney stone     Past Surgical History:  Procedure Laterality Date   INNER EAR SURGERY     LITHOTRIPSY      Family History  Problem Relation Age of Onset   Healthy Mother    Heart failure Father    Bipolar disorder Neg Hx    Depression Neg Hx      Social History   Socioeconomic History   Marital status: Single    Spouse name: Not on file   Number of children: Not on file   Years of education: Not on file   Highest education level: Not on file  Occupational History   Not on file  Tobacco Use   Smoking status: Former    Current packs/day: 1.00    Average packs/day: 1 pack/day for 12.0 years (12.0 ttl pk-yrs)    Types: Cigarettes   Smokeless tobacco: Never  Vaping Use   Vaping status: Every Day   Last attempt to quit: 09/02/2018  Substance and Sexual Activity   Alcohol use: Yes    Comment: occ   Drug use: No   Sexual activity: Yes  Other Topics Concern   Not on file  Social History Narrative   Not on file   Social Determinants of Health   Financial Resource Strain: Not on file  Food Insecurity: Not on file  Transportation Needs: Not on file  Physical Activity: Not on file  Stress: Not on file  Social Connections: Unknown (01/28/2022)   [facility]   Social Network    Social Network: Not on file  Intimate Partner Violence: Unknown (12/25/2021)   Received from [facility]   HITS    Physically Hurt: Not on file  Insult or Talk Down To: Not on file    Threaten Physical Harm: Not on file    Scream or Curse: Not on file     Current Outpatient Medications:    esomeprazole (NEXIUM) 40 MG capsule, Take 40 mg by mouth 3 (three) times daily., Disp: , Rfl:    amLODipine (NORVASC) 5 MG tablet, Take 1 tablet (5 mg total) by mouth daily., Disp: 90 tablet, Rfl: 0   buPROPion (WELLBUTRIN XL) 150 MG 24 hr tablet, Take 1 tablet (150 mg total) by mouth daily., Disp: 90 tablet, Rfl: 0   busPIRone (BUSPAR) 10 MG tablet, Take 1 tablet by mouth twice daily., Disp: 180 tablet, Rfl: 0  Current Facility-Administered Medications:    0.9 %  sodium chloride infusion, 500 mL, Intravenous, Continuous, Cunningham, Dub Amis, MD  EXAM:  VITALS per patient if applicable: BP (!) 250/180   Pulse 90   Ht 5\' 9"  (1.753  m)   Wt 210 lb (95.3 kg)   BMI 31.01 kg/m   GENERAL: alert, oriented, appears well and in no acute distress  HEENT: atraumatic, conjunttiva clear, no obvious abnormalities on inspection of external nose and ears  NECK: normal movements of the head and neck  LUNGS: on inspection no signs of respiratory distress, breathing rate appears normal, no obvious gross SOB, gasping or wheezing  CV: no obvious cyanosis  MS: moves all visible extremities without noticeable abnormality  PSYCH/NEURO: pleasant and cooperative, no obvious depression or anxiety, speech and thought processing grossly intact  ASSESSMENT AND PLAN:  Discussed the following assessment and plan:  Essential hypertension BP significantly elevated on home cuff.  He will pick up a new cuff and let me know readings over the next 48 hours.  Offered nurse visit to check in office but he declines at this time. Continue amlodipine at current strength.    Generalized anxiety disorder Doing well with combination of bupropion and BuSpar.  He will continue these at current strength.     I discussed the assessment and treatment plan with the patient. The patient was provided an opportunity to ask questions and all were answered. The patient agreed with the plan and demonstrated an understanding of the instructions.   The patient was advised to call back or seek an in-person evaluation if the symptoms worsen or if the condition fails to improve as anticipated.    Gabriel Coombe, DO

## 2023-05-20 NOTE — Assessment & Plan Note (Signed)
Doing well with combination of bupropion and BuSpar.  He will continue these at current strength.

## 2023-05-27 ENCOUNTER — Encounter: Payer: Self-pay | Admitting: Family Medicine

## 2023-05-27 ENCOUNTER — Other Ambulatory Visit: Payer: Self-pay | Admitting: Family Medicine

## 2023-05-27 DIAGNOSIS — I10 Essential (primary) hypertension: Secondary | ICD-10-CM

## 2023-05-27 MED ORDER — AMLODIPINE BESYLATE 10 MG PO TABS
10.0000 mg | ORAL_TABLET | Freq: Every day | ORAL | 1 refills | Status: DC
Start: 2023-05-27 — End: 2023-10-06

## 2023-08-10 ENCOUNTER — Other Ambulatory Visit: Payer: Self-pay | Admitting: Family Medicine

## 2023-09-24 ENCOUNTER — Other Ambulatory Visit: Payer: Self-pay | Admitting: Medical Genetics

## 2023-10-05 ENCOUNTER — Other Ambulatory Visit: Payer: Self-pay | Admitting: Family Medicine

## 2023-10-05 DIAGNOSIS — I10 Essential (primary) hypertension: Secondary | ICD-10-CM

## 2023-10-06 NOTE — Telephone Encounter (Signed)
 Requesting rx rf of  amlodipine 10 mg - last written 05/27/2023 Bupropion 150mg  - last written 05/20/2023 Last OV 08/27/224 Upcoming appt = none

## 2023-11-05 ENCOUNTER — Other Ambulatory Visit: Payer: Self-pay | Admitting: Family Medicine

## 2023-11-05 DIAGNOSIS — I10 Essential (primary) hypertension: Secondary | ICD-10-CM

## 2023-11-28 ENCOUNTER — Other Ambulatory Visit: Payer: Self-pay | Admitting: Family Medicine

## 2023-11-28 NOTE — Telephone Encounter (Signed)
 Patient contact pt to schedule appt for refills. Thanks

## 2023-11-28 NOTE — Telephone Encounter (Signed)
 Lvm for patient to call back to schedule a med refill. Tvt

## 2023-12-08 ENCOUNTER — Telehealth: Payer: Self-pay | Admitting: Family Medicine

## 2023-12-08 NOTE — Telephone Encounter (Signed)
 Copied from CRM [id number]. Topic: Appointments - Scheduling Inquiry for Clinic >> Dec 05, 2023 11:02 AM Gabriel Shelton wrote: Reason for CRM: Gabriel Shelton is calling to inquire about his prescription. Informed he needs to schedule an appointment. Gabriel Shelton is requesting a virtual or video, as he now lives in [place]. Attempted to inform Gabriel Shelton this will need to be in-person, as he has not been seen in over a year. Gabriel Shelton is requesting a video anyway.  Called Gabriel Shelton left vm in order to do a mychart video visit you need to be in [place] and address needs to be updated.

## 2023-12-09 NOTE — Telephone Encounter (Signed)
 Thank you I let him know that just wanted to make sure

## 2024-01-24 ENCOUNTER — Other Ambulatory Visit: Payer: Self-pay | Admitting: Family Medicine

## 2024-01-24 DIAGNOSIS — I10 Essential (primary) hypertension: Secondary | ICD-10-CM

## 2024-01-26 NOTE — Telephone Encounter (Signed)
 Pls contact pt to schedule appt for HTN and Mood medication refills. Last seen 04/2023. Unable to refill meds without kept appt. Thx.

## 2024-01-26 NOTE — Telephone Encounter (Signed)
Called patient, LVM to call office to schedule appointment, thanks.  

## 2024-07-04 ENCOUNTER — Other Ambulatory Visit: Payer: Self-pay | Admitting: Medical Genetics

## 2024-07-04 DIAGNOSIS — Z006 Encounter for examination for normal comparison and control in clinical research program: Secondary | ICD-10-CM

## 2024-08-02 LAB — GENECONNECT MOLECULAR SCREEN: Genetic Analysis Overall Interpretation: NEGATIVE
